# Patient Record
Sex: Female | Born: 1975
Health system: Southern US, Community
[De-identification: ages and names within clinical notes are randomized; demographics above are authoritative.]

## PROBLEM LIST (undated history)

## (undated) DIAGNOSIS — N870 Mild cervical dysplasia: Secondary | ICD-10-CM

## (undated) DIAGNOSIS — M069 Rheumatoid arthritis, unspecified: Secondary | ICD-10-CM

## (undated) DIAGNOSIS — F419 Anxiety disorder, unspecified: Secondary | ICD-10-CM

## (undated) DIAGNOSIS — T7840XA Allergy, unspecified, initial encounter: Secondary | ICD-10-CM

## (undated) DIAGNOSIS — I10 Essential (primary) hypertension: Secondary | ICD-10-CM

## (undated) DIAGNOSIS — B009 Herpesviral infection, unspecified: Secondary | ICD-10-CM

## (undated) DIAGNOSIS — K219 Gastro-esophageal reflux disease without esophagitis: Secondary | ICD-10-CM

## (undated) HISTORY — DX: Essential (primary) hypertension: I10

## (undated) HISTORY — DX: Anxiety disorder, unspecified: F41.9

## (undated) HISTORY — DX: Gastro-esophageal reflux disease without esophagitis: K21.9

## (undated) HISTORY — DX: Allergy, unspecified, initial encounter: T78.40XA

## (undated) HISTORY — DX: Herpesviral infection, unspecified: B00.9

---

## 2014-10-25 HISTORY — PX: BREAST BIOPSY: SHX20

## 2016-12-13 ENCOUNTER — Emergency Department (HOSPITAL_COMMUNITY)
Admission: EM | Admit: 2016-12-13 | Discharge: 2016-12-13 | Disposition: A | Discharge status: Home / Self Care | Payer: Self-pay | Attending: Emergency Medicine | Admitting: Emergency Medicine

## 2016-12-13 ENCOUNTER — Encounter (HOSPITAL_COMMUNITY): Payer: Self-pay | Admitting: *Deleted

## 2016-12-13 DIAGNOSIS — J069 Acute upper respiratory infection, unspecified: Secondary | ICD-10-CM | POA: Insufficient documentation

## 2016-12-13 MED ORDER — KETOROLAC TROMETHAMINE 30 MG/ML IJ SOLN
15.0000 mg | Freq: Once | INTRAMUSCULAR | Status: AC
  Administered 2016-12-13: 15 mg via INTRAVENOUS
  Filled 2016-12-13: qty 1

## 2016-12-13 MED ORDER — SODIUM CHLORIDE 0.9 % IV BOLUS (SEPSIS)
1000.0000 mL | Freq: Once | INTRAVENOUS | Status: AC
  Administered 2016-12-13: 1000 mL via INTRAVENOUS

## 2016-12-13 MED ORDER — ONDANSETRON HCL 4 MG/2ML IJ SOLN
4.0000 mg | Freq: Once | INTRAMUSCULAR | Status: AC
  Administered 2016-12-13: 4 mg via INTRAVENOUS
  Filled 2016-12-13: qty 2

## 2016-12-13 NOTE — ED Provider Notes (Signed)
[facility]-EMERGENCY DEPT Provider Note   CSN: [id number] Arrival date & time: 12/13/16  1046   By signing my name below, I, Avnee Patel, attest that this documentation has been prepared under the direction and in the presence of Benjiman Core, MD  Electronically Signed: Clovis Pu, ED Scribe. 12/13/16. 12:12 PM.   History   Chief Complaint Chief Complaint  Patient presents with  . URI  . Headache   The history is provided by the patient. No language interpreter was used.   HPI Comments:  Brandy Vasquez is a 41 y.o. female who presents to the Emergency Department complaining of persistent productive cough onset 3 days. Pt also reports sore throat, generalized body aches, headache, nausea, diarrhea and recent sick contacts. No alleviating factors noted. Pt denies vomiting, pain with urination, frequency, chance of pregnancy, hx of DM, any major medical problems or any other associated symptoms. Pt is a non-smoker. Marland Kitchen   History reviewed. No pertinent past medical history.  There are no active problems to display for this patient.   History reviewed. No pertinent surgical history.  OB History    No data available       Home Medications    Prior to Admission medications   Not on File    Family History No family history on file.  Social History Social History  Substance Use Topics  . Smoking status: Never Smoker  . Smokeless tobacco: Never Used  . Alcohol use No     Allergies   Bactrim [sulfamethoxazole-trimethoprim]   Review of Systems Review of Systems  HENT: Positive for sore throat.   Respiratory: Positive for cough.   Gastrointestinal: Positive for diarrhea and nausea. Negative for vomiting.  Genitourinary: Negative for frequency.  Musculoskeletal: Positive for myalgias.  Neurological: Positive for headaches.     Physical Exam Updated Vital Signs BP 111/91 (BP Location: Right Arm)   Pulse 105   Temp 99 F (37.2 C) (Oral)   Resp 16   Ht 5\' 6"   (1.676 m)   Wt 195 lb (88.5 kg)   LMP 11/25/2016   SpO2 98%   BMI 31.47 kg/m   Physical Exam  Constitutional: She is oriented to person, place, and time. She appears well-developed and well-nourished. No distress.  HENT:  Head: Normocephalic and atraumatic.  Mouth/Throat: No posterior oropharyngeal edema.  Cardiovascular: Normal rate.   Pulmonary/Chest: Effort normal and breath sounds normal.  Abdominal: She exhibits no distension. There is no tenderness.  Lymphadenopathy:    She has no cervical adenopathy.  Neurological: She is alert and oriented to person, place, and time.  Skin: Skin is warm and dry.  Psychiatric: She has a normal mood and affect.  Nursing note and vitals reviewed.  ED Treatments / Results  COORDINATION OF CARE:  12:10 PM Discussed treatment plan with pt at bedside and pt agreed to plan.  Labs (all labs ordered are listed, but only abnormal results are displayed) Labs Reviewed - No data to display  EKG  EKG Interpretation None       Radiology No results found.  Procedures Procedures (including critical care time)  Medications Ordered in ED Medications - No data to display   Initial Impression / Assessment and Plan / ED Course  I have reviewed the triage vital signs and the nursing notes.  Pertinent labs & imaging results that were available during my care of the patient were reviewed by me and considered in my medical decision making (see chart for details).  Patient presents with URI symptoms. Body aches. Sore throat. Overall benign exam. Lungs are clear. Feels better after IV fluids and some dramatic treatment. Will discharge home.  Final Clinical Impressions(s) / ED Diagnoses   Final diagnoses:  None    New Prescriptions New Prescriptions   No medications on file  I personally performed the services described in this documentation, which was scribed in my presence. The recorded information has been reviewed and is accurate.         Benjiman CoreNathan Datron Brakebill, MD 12/13/16 [phone]

## 2016-12-13 NOTE — ED Triage Notes (Addendum)
PT states headache, cough, nausea, body aches, R sided sore throat since Fri.  Woke up this am and symptoms were worse.

## 2018-04-16 ENCOUNTER — Emergency Department (HOSPITAL_BASED_OUTPATIENT_CLINIC_OR_DEPARTMENT_OTHER)
Admission: EM | Admit: 2018-04-16 | Discharge: 2018-04-16 | Disposition: A | Discharge status: Home / Self Care | Payer: 59 | Attending: Emergency Medicine | Admitting: Emergency Medicine

## 2018-04-16 ENCOUNTER — Encounter (HOSPITAL_BASED_OUTPATIENT_CLINIC_OR_DEPARTMENT_OTHER): Payer: Self-pay

## 2018-04-16 ENCOUNTER — Other Ambulatory Visit: Payer: Self-pay

## 2018-04-16 DIAGNOSIS — M5412 Radiculopathy, cervical region: Secondary | ICD-10-CM | POA: Diagnosis not present

## 2018-04-16 DIAGNOSIS — M542 Cervicalgia: Secondary | ICD-10-CM | POA: Diagnosis present

## 2018-04-16 MED ORDER — METHOCARBAMOL 750 MG PO TABS: 750.0000 mg | ORAL_TABLET | Freq: Two times a day (BID) | ORAL | 0 refills | Status: DC

## 2018-04-16 MED ORDER — NAPROXEN 500 MG PO TABS: 500.0000 mg | ORAL_TABLET | Freq: Two times a day (BID) | ORAL | 0 refills | Status: DC

## 2018-04-16 MED ORDER — NAPROXEN 250 MG PO TABS
500.0000 mg | ORAL_TABLET | Freq: Once | ORAL | Status: AC
  Administered 2018-04-16: 500 mg via ORAL
  Filled 2018-04-16: qty 2

## 2018-04-16 MED ORDER — METHOCARBAMOL 500 MG PO TABS
750.0000 mg | ORAL_TABLET | Freq: Once | ORAL | Status: AC
  Administered 2018-04-16: 750 mg via ORAL
  Filled 2018-04-16: qty 2

## 2018-04-16 NOTE — ED Triage Notes (Signed)
Pt c/o neck pain x 2 months. Today pain started radiating into L arm.

## 2018-04-16 NOTE — ED Notes (Signed)
ED Provider at bedside. 

## 2018-04-16 NOTE — ED Notes (Signed)
Pt given d/c instructions as per chart. Rx x 2 with precautions. Verbalizes understanding. No questions. 

## 2018-04-16 NOTE — ED Provider Notes (Signed)
[facility] EMERGENCY DEPARTMENT Provider Note   CSN: [id number] Arrival date & time: 04/16/18  2156     History   Chief Complaint Chief Complaint  Patient presents with  . Neck Pain    HPI Brandy Vasquez is a 42 y.o. female who is previously healthy who presents with a 55-month history of left-sided neck pain.  Today it started moving into her left arm.  She has had intermittent tingling.  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  She is right-handed.  She has tried [place] powders at home without relief.  She denies any chest pain, shortness of breath, abdominal pain, nausea, vomiting.  She denies any low back pain.  HPI  History reviewed. No pertinent past medical history.  There are no active problems to display for this patient.   History reviewed. No pertinent surgical history.   OB History   None      Home Medications    Prior to Admission medications   Medication Sig Start Date End Date Taking? Authorizing Provider  methocarbamol (ROBAXIN) 750 MG tablet Take 1 tablet (750 mg total) by mouth 2 (two) times daily. 04/16/18   Niamh Rada, Waylan Boga, PA-C  naproxen (NAPROSYN) 500 MG tablet Take 1 tablet (500 mg total) by mouth 2 (two) times daily. 04/16/18   Emi Holes, PA-C    Family History No family history on file.  Social History Social History   Tobacco Use  . Smoking status: Never Smoker  . Smokeless tobacco: Never Used  Substance Use Topics  . Alcohol use: No  . Drug use: No     Allergies   Bactrim [sulfamethoxazole-trimethoprim]   Review of Systems Review of Systems  Constitutional: Negative for fever.  Respiratory: Negative for shortness of breath.   Cardiovascular: Negative for chest pain.  Musculoskeletal: Positive for back pain (L upper), myalgias and neck pain.  Neurological: Positive for numbness (paresthesia).     Physical Exam Updated Vital Signs BP (!) 146/102 (BP Location: Right Arm)   Pulse 66    Temp 98.2 F (36.8 C) (Oral)   Resp 18   Ht 5\' 6"  (1.676 m)   Wt 83.9 kg (185 lb)   LMP 04/02/2018 (Approximate)   SpO2 100%   BMI 29.86 kg/m   Physical Exam  Constitutional: She appears well-developed and well-nourished. No distress.  HENT:  Head: Normocephalic and atraumatic.  Mouth/Throat: Oropharynx is clear and moist. No oropharyngeal exudate.  Eyes: Pupils are equal, round, and reactive to light. Conjunctivae are normal. Right eye exhibits no discharge. Left eye exhibits no discharge. No scleral icterus.  Neck: Normal range of motion. Neck supple. No thyromegaly present.  Cardiovascular: Normal rate, regular rhythm, normal heart sounds and intact distal pulses. Exam reveals no gallop and no friction rub.  No murmur heard. Pulmonary/Chest: Effort normal and breath sounds normal. No stridor. No respiratory distress. She has no wheezes. She has no rales.  Abdominal: Soft. Bowel sounds are normal. She exhibits no distension. There is no tenderness. There is no rebound and no guarding.  Musculoskeletal: She exhibits no edema.       Cervical back: She exhibits tenderness and spasm.  No midline cervical or thoracic tenderness, but tenderness to the left upper trapezius and left thoracic paraspinal muscles, spasm noted  Lymphadenopathy:    She has no cervical adenopathy.  Neurological: She is alert. Coordination normal.  5/5 strength to bilateral upper extremities, equal bilateral grip strength, sensation intact  Skin: Skin is warm and dry. No rash noted. She is not diaphoretic. No pallor.  Psychiatric: She has a normal mood and affect.  Nursing note and vitals reviewed.    ED Treatments / Results  Labs (all labs ordered are listed, but only abnormal results are displayed) Labs Reviewed - No data to display  EKG None  Radiology No results found.  Procedures Procedures (including critical care time)  Medications Ordered in ED Medications  naproxen (NAPROSYN) tablet 500  mg (has no administration in time range)  methocarbamol (ROBAXIN) tablet 750 mg (has no administration in time range)     Initial Impression / Assessment and Plan / ED Course  I have reviewed the triage vital signs and the nursing notes.  Pertinent labs & imaging results that were available during my care of the patient were reviewed by me and considered in my medical decision making (see chart for details).     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  Patient is neurovascularly intact.  Normal neuro exam.  She is noted to have significant spasm to her left upper trapezius.  No bony tenderness.  No indication for imaging today.  We will treat supportively with heat, stretching, massage, NSAIDs, and muscle relaxer.  Follow-up to sports medicine if symptoms are not improving.  Patient understands and agrees with plan.  Strict return precautions discussed.  Patient vitals stable throughout ED course and discharged in satisfactory condition.  Final Clinical Impressions(s) / ED Diagnoses   Final diagnoses:  Cervical radiculopathy    ED Discharge Orders        Ordered    methocarbamol (ROBAXIN) 750 MG tablet  2 times daily     04/16/18 2253    naproxen (NAPROSYN) 500 MG tablet  2 times daily     04/16/18 2253       Emi HolesLaw, Belicia Difatta M, PA-C 04/16/18 2253    Mesner, Barbara CowerJason, MD 04/18/18 1219

## 2018-04-16 NOTE — Discharge Instructions (Signed)
Medications: Naprosyn, Robaxin  Treatment: Take Naprosyn twice daily as prescribed.  You can alternate with Tylenol in between.  Take Robaxin twice daily as needed for muscle pain or spasms.  Do not drive or operate machinery while taking this medication as it can make you feel sedated.  Use heat and ice alternating 20 minutes on, 20 minutes off.  Attempt the exercises and stretches we discussed a few times daily.  You may want to treat yourself to massage.  Follow-up: Please follow-up with Dr. Pearletha ForgeHudnall if your symptoms are not improving over the next couple weeks after trying the above recommendations.  Please return to the emergency department if you develop any new or worsening symptoms including complete numbness of your arm, or any other new or concerning symptom.

## 2018-08-31 DIAGNOSIS — N870 Mild cervical dysplasia: Secondary | ICD-10-CM | POA: Insufficient documentation

## 2019-02-01 ENCOUNTER — Other Ambulatory Visit: Payer: Self-pay

## 2019-02-01 ENCOUNTER — Encounter (HOSPITAL_BASED_OUTPATIENT_CLINIC_OR_DEPARTMENT_OTHER): Payer: Self-pay | Admitting: *Deleted

## 2019-02-01 ENCOUNTER — Emergency Department (HOSPITAL_BASED_OUTPATIENT_CLINIC_OR_DEPARTMENT_OTHER): Payer: 59

## 2019-02-01 ENCOUNTER — Emergency Department (HOSPITAL_BASED_OUTPATIENT_CLINIC_OR_DEPARTMENT_OTHER)
Admission: EM | Admit: 2019-02-01 | Discharge: 2019-02-01 | Disposition: A | Discharge status: Home / Self Care | Payer: 59 | Attending: Emergency Medicine | Admitting: Emergency Medicine

## 2019-02-01 DIAGNOSIS — R1013 Epigastric pain: Secondary | ICD-10-CM

## 2019-02-01 LAB — CBC WITH DIFFERENTIAL/PLATELET
Abs Immature Granulocytes: 0.03 10*3/uL (ref 0.00–0.07)
Basophils Absolute: 0 10*3/uL (ref 0.0–0.1)
Basophils Relative: 0 %
Eosinophils Absolute: 0.3 10*3/uL (ref 0.0–0.5)
Eosinophils Relative: 2 %
HCT: 41.4 % (ref 36.0–46.0)
Hemoglobin: 13.5 g/dL (ref 12.0–15.0)
Immature Granulocytes: 0 %
Lymphocytes Relative: 8 %
Lymphs Abs: 1 10*3/uL (ref 0.7–4.0)
MCH: 31.2 pg (ref 26.0–34.0)
MCHC: 32.6 g/dL (ref 30.0–36.0)
MCV: 95.6 fL (ref 80.0–100.0)
Monocytes Absolute: 0.7 10*3/uL (ref 0.1–1.0)
Monocytes Relative: 6 %
Neutro Abs: 10.3 10*3/uL — ABNORMAL HIGH (ref 1.7–7.7)
Neutrophils Relative %: 84 %
Platelets: 275 10*3/uL (ref 150–400)
RBC: 4.33 MIL/uL (ref 3.87–5.11)
RDW: 12.1 % (ref 11.5–15.5)
WBC: 12.4 10*3/uL — ABNORMAL HIGH (ref 4.0–10.5)
nRBC: 0 % (ref 0.0–0.2)

## 2019-02-01 LAB — URINALYSIS, ROUTINE W REFLEX MICROSCOPIC
Bilirubin Urine: NEGATIVE
Glucose, UA: NEGATIVE mg/dL
Ketones, ur: NEGATIVE mg/dL
Leukocytes,Ua: NEGATIVE
Nitrite: NEGATIVE
Protein, ur: NEGATIVE mg/dL
Specific Gravity, Urine: 1.015 (ref 1.005–1.030)
pH: 6.5 (ref 5.0–8.0)

## 2019-02-01 LAB — COMPREHENSIVE METABOLIC PANEL
ALT: 17 U/L (ref 0–44)
AST: 23 U/L (ref 15–41)
Albumin: 4.2 g/dL (ref 3.5–5.0)
Alkaline Phosphatase: 88 U/L (ref 38–126)
Anion gap: 6 (ref 5–15)
BUN: 10 mg/dL (ref 6–20)
CO2: 25 mmol/L (ref 22–32)
Calcium: 8.7 mg/dL — ABNORMAL LOW (ref 8.9–10.3)
Chloride: 107 mmol/L (ref 98–111)
Creatinine, Ser: 0.7 mg/dL (ref 0.44–1.00)
GFR calc Af Amer: >60 mL/min (ref >60)
GFR calc non Af Amer: >60 mL/min (ref >60)
Glucose, Bld: 87 mg/dL (ref 70–99)
Potassium: 3.5 mmol/L (ref 3.5–5.1)
Sodium: 138 mmol/L (ref 135–145)
Total Bilirubin: 1.1 mg/dL (ref 0.3–1.2)
Total Protein: 7.1 g/dL (ref 6.5–8.1)

## 2019-02-01 LAB — LIPASE, BLOOD: Lipase: 40 U/L (ref 11–51)

## 2019-02-01 LAB — URINALYSIS, MICROSCOPIC (REFLEX)

## 2019-02-01 LAB — PREGNANCY, URINE: Preg Test, Ur: NEGATIVE

## 2019-02-01 MED ORDER — IOHEXOL 300 MG/ML  SOLN
100.0000 mL | Freq: Once | INTRAMUSCULAR | Status: AC | PRN
  Administered 2019-02-01: 16:00:00 100 mL via INTRAVENOUS

## 2019-02-01 MED ORDER — HYDROMORPHONE HCL 1 MG/ML IJ SOLN
0.5000 mg | Freq: Once | INTRAMUSCULAR | Status: AC
  Administered 2019-02-01: 0.5 mg via INTRAVENOUS
  Filled 2019-02-01: qty 1

## 2019-02-01 MED ORDER — FAMOTIDINE 20 MG PO TABS: 20.0000 mg | ORAL_TABLET | Freq: Two times a day (BID) | ORAL | 0 refills | Status: DC

## 2019-02-01 MED ORDER — ONDANSETRON HCL 4 MG/2ML IJ SOLN
4.0000 mg | Freq: Once | INTRAMUSCULAR | Status: AC
  Administered 2019-02-01: 16:00:00 4 mg via INTRAVENOUS
  Filled 2019-02-01: qty 2

## 2019-02-01 MED ORDER — PANTOPRAZOLE SODIUM 40 MG IV SOLR
40.0000 mg | Freq: Once | INTRAVENOUS | Status: AC
  Administered 2019-02-01: 16:00:00 40 mg via INTRAVENOUS
  Filled 2019-02-01: qty 40

## 2019-02-01 MED ORDER — SODIUM CHLORIDE 0.9 % IV SOLN
INTRAVENOUS | Status: DC
  Administered 2019-02-01: 16:00:00 via INTRAVENOUS

## 2019-02-01 NOTE — ED Notes (Signed)
Initial contact with patient. Patient ambulatory to BR with steady gait. A&ox4.

## 2019-02-01 NOTE — ED Provider Notes (Signed)
[facility] EMERGENCY DEPARTMENT Provider Note   CSN: [id number] Arrival date & time: 02/01/19  1458    History   Chief Complaint Chief Complaint  Patient presents with  . Abdominal Pain    HPI Brandy Vasquez is a 43 y.o. female.     Patient with acute onset of epigastric abdominal pain.  Does not radiate to back.  Associated with nausea.  Onset of pain was at 8 PM last evening.  Pain is been constant since then.  Never had pain like this before.  Patient denies any fever body aches cough or congestion.  No vomiting.     History reviewed. No pertinent past medical history.  There are no active problems to display for this patient.   History reviewed. No pertinent surgical history.   OB History   No obstetric history on file.      Home Medications    Prior to Admission medications   Medication Sig Start Date End Date Taking? Authorizing Provider  famotidine (PEPCID) 20 MG tablet Take 1 tablet (20 mg total) by mouth 2 (two) times daily. 02/01/19   Vanetta Mulders, MD    Family History History reviewed. No pertinent family history.  Social History Social History   Tobacco Use  . Smoking status: Never Smoker  . Smokeless tobacco: Never Used  Substance Use Topics  . Alcohol use: No  . Drug use: No     Allergies   Bactrim [sulfamethoxazole-trimethoprim]   Review of Systems Review of Systems  Constitutional: Negative for chills and fever.  HENT: Negative for rhinorrhea and sore throat.   Eyes: Negative for visual disturbance.  Respiratory: Negative for cough and shortness of breath.   Cardiovascular: Negative for chest pain and leg swelling.  Gastrointestinal: Positive for abdominal pain and nausea. Negative for diarrhea and vomiting.  Genitourinary: Negative for dysuria.  Musculoskeletal: Negative for back pain and neck pain.  Skin: Negative for rash.  Neurological: Negative for dizziness, light-headedness and headaches.  Hematological: Does  not bruise/bleed easily.  Psychiatric/Behavioral: Negative for confusion.     Physical Exam Updated Vital Signs BP 124/90   Pulse 75   Temp 99.1 F (37.3 C) (Oral)   Resp 17   Ht 1.676 m (5\' 6" )   Wt 88.5 kg   SpO2 94%   BMI 31.47 kg/m   Physical Exam Vitals signs and nursing note reviewed.  Constitutional:      General: She is not in acute distress.    Appearance: She is well-developed.  HENT:     Head: Normocephalic and atraumatic.     Nose: No congestion.  Eyes:     Extraocular Movements: Extraocular movements intact.     Conjunctiva/sclera: Conjunctivae normal.     Pupils: Pupils are equal, round, and reactive to light.  Neck:     Musculoskeletal: Neck supple.  Cardiovascular:     Rate and Rhythm: Normal rate and regular rhythm.     Heart sounds: Normal heart sounds. No murmur.  Pulmonary:     Effort: Pulmonary effort is normal. No respiratory distress.     Breath sounds: Normal breath sounds. No wheezing.  Abdominal:     General: Bowel sounds are normal.     Palpations: Abdomen is soft.     Tenderness: There is abdominal tenderness. There is no guarding.     Comments: Tender to palpation epigastric area.  No significant tenderness to right upper quadrant.  No lower quadrant tenderness.  Musculoskeletal:  General: No swelling.  Skin:    General: Skin is warm and dry.  Neurological:     General: No focal deficit present.     Mental Status: She is alert and oriented to person, place, and time.      ED Treatments / Results  Labs (all labs ordered are listed, but only abnormal results are displayed) Labs Reviewed  URINALYSIS, ROUTINE W REFLEX MICROSCOPIC - Abnormal; Notable for the following components:      Result Value   APPearance HAZY (*)    Hgb urine dipstick TRACE (*)    All other components within normal limits  COMPREHENSIVE METABOLIC PANEL - Abnormal; Notable for the following components:   Calcium 8.7 (*)    All other components within  normal limits  CBC WITH DIFFERENTIAL/PLATELET - Abnormal; Notable for the following components:   WBC 12.4 (*)    Neutro Abs 10.3 (*)    All other components within normal limits  URINALYSIS, MICROSCOPIC (REFLEX) - Abnormal; Notable for the following components:   Bacteria, UA MANY (*)    All other components within normal limits  PREGNANCY, URINE  LIPASE, BLOOD    EKG EKG Interpretation  Date/Time:  Thursday February 01 2019 15:31:25 EDT Ventricular Rate:  91 PR Interval:    QRS Duration: 85 QT Interval:  350 QTC Calculation: 431 R Axis:   42 Text Interpretation:  Sinus rhythm Low voltage, precordial leads Borderline T abnormalities, anterior leads No previous ECGs available Confirmed by Vanetta Mulders [phone]) on 02/01/2019 3:33:47 PM Also confirmed by Vanetta Mulders [phone]), editor Barbette Hair [phone])  on 02/01/2019 3:35:26 PM   Radiology Ct Abdomen Pelvis W Contrast  Result Date: 02/01/2019 CLINICAL DATA:  Epigastric abdominal pain, nausea EXAM: CT ABDOMEN AND PELVIS WITH CONTRAST TECHNIQUE: Multidetector CT imaging of the abdomen and pelvis was performed using the standard protocol following bolus administration of intravenous contrast. CONTRAST:  OMNIPAQUE IOHEXOL 300 MG/ML  SOLN COMPARISON:  None. FINDINGS: Lower chest: No acute abnormality. Hepatobiliary: No focal liver abnormality is seen. No gallstones, gallbladder wall thickening, or biliary dilatation. Pancreas: Unremarkable. No pancreatic ductal dilatation or surrounding inflammatory changes. Spleen: Normal in size without focal abnormality. Adrenals/Urinary Tract: Adrenal glands are unremarkable. Kidneys are normal, without renal calculi, focal lesion, or hydronephrosis. Bladder is unremarkable. Stomach/Bowel: Stomach is within normal limits. Appendix appears normal. No evidence of bowel wall thickening, distention, or inflammatory changes. Occasional sigmoid diverticula. Vascular/Lymphatic: No significant vascular  findings are present. No enlarged abdominal or pelvic lymph nodes. Reproductive: No mass or other abnormality. Other: No abdominal wall hernia or abnormality. Small volume free fluid in the low pelvis, likely functional. Musculoskeletal: No acute or significant osseous findings. IMPRESSION: 1. No acute CT findings of the abdomen or pelvis to explain epigastric abdominal pain. 2. Occasional sigmoid diverticula without evidence of acute diverticulitis. 3. Small volume free fluid in the low pelvis, likely functional. Electronically Signed   By: Lauralyn Primes M.D.   On: 02/01/2019 16:48    Procedures Procedures (including critical care time)  Medications Ordered in ED Medications  0.9 %  sodium chloride infusion ( Intravenous New Bag/Given 02/01/19 1534)  ondansetron (ZOFRAN) injection 4 mg (4 mg Intravenous Given 02/01/19 1535)  HYDROmorphone (DILAUDID) injection 0.5 mg (0.5 mg Intravenous Given 02/01/19 1541)  pantoprazole (PROTONIX) injection 40 mg (40 mg Intravenous Given 02/01/19 1535)  iohexol (OMNIPAQUE) 300 MG/ML solution 100 mL (100 mLs Intravenous Contrast Given 02/01/19 1621)     Initial Impression / Assessment and Plan /  ED Course  I have reviewed the triage vital signs and the nursing notes.  Pertinent labs & imaging results that were available during my care of the patient were reviewed by me and considered in my medical decision making (see chart for details).       CT scan without any acute findings.  Labs without significant abnormality we will give a 2-week course of Pepcid in case is peptic ulcer disease.  Patient will return for any new or worse symptoms.  If symptoms persist beyond that she will get follow-up either here or somewhere else for possible referral to gastroenterology.   Final Clinical Impressions(s) / ED Diagnoses   Final diagnoses:  Epigastric pain    ED Discharge Orders         Ordered    famotidine (PEPCID) 20 MG tablet  2 times daily     02/01/19 1659            Vanetta MuldersZackowski, Retal Tonkinson, MD 02/01/19 1702

## 2019-02-01 NOTE — ED Notes (Signed)
Patient verbalizes DC instructions. Ambulatory to ED exit with steady gait.

## 2019-02-01 NOTE — ED Triage Notes (Signed)
Pt c/ epigastric abd pain x 2 days nausea only no relief with gas med

## 2019-02-01 NOTE — Discharge Instructions (Addendum)
Take the Pepcid as directed over the next 2 weeks.  Return for any new or worse symptoms.  Also will need follow-up if you do not improve taking the medication.  CT scan of the abdomen and pelvis without any significant findings.  Can also take Tylenol with this medicine.  Work note provided.

## 2019-02-01 NOTE — ED Notes (Signed)
Patient to CT in stable condition.

## 2019-06-02 ENCOUNTER — Other Ambulatory Visit: Payer: Self-pay

## 2019-06-02 ENCOUNTER — Encounter (HOSPITAL_BASED_OUTPATIENT_CLINIC_OR_DEPARTMENT_OTHER): Payer: Self-pay | Admitting: Adult Health

## 2019-06-02 ENCOUNTER — Emergency Department (HOSPITAL_BASED_OUTPATIENT_CLINIC_OR_DEPARTMENT_OTHER): Payer: 59

## 2019-06-02 ENCOUNTER — Emergency Department (HOSPITAL_BASED_OUTPATIENT_CLINIC_OR_DEPARTMENT_OTHER)
Admission: EM | Admit: 2019-06-02 | Discharge: 2019-06-02 | Disposition: A | Discharge status: Home / Self Care | Payer: 59 | Attending: Emergency Medicine | Admitting: Emergency Medicine

## 2019-06-02 DIAGNOSIS — Z882 Allergy status to sulfonamides status: Secondary | ICD-10-CM | POA: Insufficient documentation

## 2019-06-02 DIAGNOSIS — M25561 Pain in right knee: Secondary | ICD-10-CM | POA: Insufficient documentation

## 2019-06-02 MED ORDER — NAPROXEN 500 MG PO TABS: 500.0000 mg | ORAL_TABLET | Freq: Two times a day (BID) | ORAL | 0 refills | Status: AC

## 2019-06-02 NOTE — ED Triage Notes (Signed)
Brandy Vasquez presents with 10 days of right leg pain that started in the anterior, medial aspect of the knee, since it has spread to to medial aspect of the thigh and down the anterior side of the leg to the ankle. No swelling noted.

## 2019-06-02 NOTE — Discharge Instructions (Addendum)
Your x-ray today was within normal limits, I have provided a knee sleeve, please wear this for your comfort.  You may continue taking anti-inflammatories to help with your symptoms.  You may also apply ice or heat to the area along with elevate your leg.  Phone number to Dr. Karlton Lemon is attached to your chart, please schedule an appointment for further evaluation of your right knee pain.

## 2019-06-02 NOTE — ED Provider Notes (Signed)
[facility] EMERGENCY DEPARTMENT Provider Note   CSN: [id number] Arrival date & time: 06/02/19  1752    History   Chief Complaint Chief Complaint  Patient presents with  . Leg Pain    HPI Brandy Vasquez is a 43 y.o. female.     43 y.o female with no PMH presents to the ED with a chief complaint of right knee pain x2 weeks.  Patient reports she originally noted pain along the medial aspect of her knee which has now turned into the lateral aspect with radiation onto the right foot.  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  She reports some pain along her foot worse with dorsiflexion.  She has taken some ibuprofen, Aleve along with elevating her knee without improvement in symptoms.  She also endorses some swelling to her right knee along with bruising.  Denies any prior history of hemophilia, rash, fever, IV drug use or trauma.  The history is provided by the patient.  Leg Pain Associated symptoms: no fever     History reviewed. No pertinent past medical history.  There are no active problems to display for this patient.   History reviewed. No pertinent surgical history.   OB History   No obstetric history on file.      Home Medications    Prior to Admission medications   Medication Sig Start Date End Date Taking? Authorizing Provider  famotidine (PEPCID) 20 MG tablet Take 1 tablet (20 mg total) by mouth 2 (two) times daily. 02/01/19   Vanetta MuldersZackowski, Scott, MD  naproxen (NAPROSYN) 500 MG tablet Take 1 tablet (500 mg total) by mouth 2 (two) times daily for 7 days. 06/02/19 06/09/19  Claude MangesSoto, Tagen Brethauer, PA-C    Family History History reviewed. No pertinent family history.  Social History Social History   Tobacco Use  . Smoking status: Never Smoker  . Smokeless tobacco: Never Used  Substance Use Topics  . Alcohol use: No  . Drug use: No     Allergies   Bactrim [sulfamethoxazole-trimethoprim]   Review of  Systems Review of Systems  Constitutional: Negative for fever.  Musculoskeletal: Positive for arthralgias.     Physical Exam Updated Vital Signs BP (!) 127/96   Pulse 79   Temp 98.5 F (36.9 C) (Oral)   Resp 18   Ht 5\' 6"  (1.676 m)   Wt 88.5 kg   LMP 05/15/2019   SpO2 99%   BMI 31.47 kg/m   Physical Exam Vitals signs and nursing note reviewed.  Constitutional:      Appearance: Normal appearance.  HENT:     Head: Normocephalic and atraumatic.     Nose: No rhinorrhea.     Mouth/Throat:     Mouth: Mucous membranes are moist.  Eyes:     Pupils: Pupils are equal, round, and reactive to light.  Cardiovascular:     Rate and Rhythm: Normal rate.  Pulmonary:     Effort: Pulmonary effort is normal.  Abdominal:     General: Abdomen is flat.     Palpations: Abdomen is soft.  Musculoskeletal:        General: Tenderness present.     Right knee: She exhibits swelling and deformity. She exhibits normal range of motion, no laceration, no erythema, normal alignment and no LCL laxity.     Comments: Neurovascularly intact, slight bruising noted to the medial knee.  No laxity on exam.  No deformity, effusion noted.  Neurological:     Mental Status: She is alert and oriented to person, place, and time.      ED Treatments / Results  Labs (all labs ordered are listed, but only abnormal results are displayed) Labs Reviewed - No data to display  EKG None  Radiology Dg Knee 2 Views Right  Result Date: 06/02/2019 CLINICAL DATA:  Pain for 10 days without trauma. EXAM: RIGHT KNEE - 1-2 VIEW COMPARISON:  None. FINDINGS: No evidence of fracture, dislocation, or joint effusion. No evidence of arthropathy or other focal bone abnormality. Soft tissues are unremarkable. IMPRESSION: Negative. Electronically Signed   By: Dorise Bullion III M.D   On: 06/02/2019 18:58    Procedures Procedures (including critical care time)  Medications Ordered in ED Medications - No data to display    Initial Impression / Assessment and Plan / ED Course  I have reviewed the triage vital signs and the nursing notes.  Pertinent labs & imaging results that were available during my care of the patient were reviewed by me and considered in my medical decision making (see chart for details).    Patient with no pertinent medical history presents to the ED with complaints of right knee pain, this is been going on for the past 2-week, states this is now causing pain along her right foot, right hip.  Patient has been taking over-the-counter medication without improvement in symptoms.  She reports applying ice along with elevation without much improvement.  During primary evaluation there is some pain along the medial aspect of her knee, there is also some bruising noted below her patella, no prior history of hemophilia.  An x-ray was obtained to further evaluate patient's condition, no fracture, dislocation or effusion noted.  No palpable effusion noted on my exam.  Mild swelling appreciated.  Will place patient on a knee sleeve along with have her follow-up with Dr. Karlton Lemon on an outpatient basis.  She also go home with a short course of anti-inflammatories to help with her pain.  No fevers, trauma, rash, gynecological complaints feel patient is appropriate for outpatient treatment.  Return precautions discussed at length.   Portions of this note were generated with L[employer]. Dictation errors may occur despite best attempts at proofreading.   Final Clinical Impressions(s) / ED Diagnoses   Final diagnoses:  Acute pain of right knee    ED Discharge Orders         Ordered    naproxen (NAPROSYN) 500 MG tablet  2 times daily     06/02/19 1919           Janeece Fitting, Hershal Coria 06/02/19 1919    Hayden Rasmussen, MD 06/02/19 1945

## 2019-06-03 ENCOUNTER — Telehealth: Payer: Self-pay | Admitting: Surgery

## 2019-06-03 NOTE — Telephone Encounter (Signed)
Received call concerning discharge prescription clarification. RNCM clarified prescription no further ED CM needs identified.

## 2020-01-08 DIAGNOSIS — B9689 Other specified bacterial agents as the cause of diseases classified elsewhere: Secondary | ICD-10-CM | POA: Diagnosis not present

## 2020-01-08 DIAGNOSIS — N930 Postcoital and contact bleeding: Secondary | ICD-10-CM | POA: Diagnosis not present

## 2020-01-08 DIAGNOSIS — N760 Acute vaginitis: Secondary | ICD-10-CM | POA: Diagnosis not present

## 2020-01-08 DIAGNOSIS — Z30431 Encounter for routine checking of intrauterine contraceptive device: Secondary | ICD-10-CM | POA: Diagnosis not present

## 2020-01-08 DIAGNOSIS — Z124 Encounter for screening for malignant neoplasm of cervix: Secondary | ICD-10-CM | POA: Diagnosis not present

## 2020-01-08 DIAGNOSIS — Z113 Encounter for screening for infections with a predominantly sexual mode of transmission: Secondary | ICD-10-CM | POA: Diagnosis not present

## 2020-06-25 DIAGNOSIS — Z30433 Encounter for removal and reinsertion of intrauterine contraceptive device: Secondary | ICD-10-CM | POA: Diagnosis not present

## 2020-06-25 DIAGNOSIS — Z3202 Encounter for pregnancy test, result negative: Secondary | ICD-10-CM | POA: Diagnosis not present

## 2020-07-06 ENCOUNTER — Emergency Department (HOSPITAL_BASED_OUTPATIENT_CLINIC_OR_DEPARTMENT_OTHER)
Admission: EM | Admit: 2020-07-06 | Discharge: 2020-07-06 | Disposition: A | Discharge status: Home / Self Care | Payer: BC Managed Care – PPO | Attending: Emergency Medicine | Admitting: Emergency Medicine

## 2020-07-06 ENCOUNTER — Other Ambulatory Visit: Payer: Self-pay

## 2020-07-06 ENCOUNTER — Encounter (HOSPITAL_BASED_OUTPATIENT_CLINIC_OR_DEPARTMENT_OTHER): Payer: Self-pay | Admitting: *Deleted

## 2020-07-06 DIAGNOSIS — U071 COVID-19: Secondary | ICD-10-CM

## 2020-07-06 DIAGNOSIS — J069 Acute upper respiratory infection, unspecified: Secondary | ICD-10-CM | POA: Diagnosis not present

## 2020-07-06 LAB — SARS CORONAVIRUS 2 BY RT PCR (HOSPITAL ORDER, PERFORMED IN ~~LOC~~ HOSPITAL LAB): SARS Coronavirus 2: POSITIVE — AB

## 2020-07-06 MED ORDER — ALBUTEROL SULFATE HFA 108 (90 BASE) MCG/ACT IN AERS
1.0000 | INHALATION_SPRAY | RESPIRATORY_TRACT | Status: DC | PRN
  Administered 2020-07-06: 1 via RESPIRATORY_TRACT
  Filled 2020-07-06: qty 6.7

## 2020-07-06 MED ORDER — IBUPROFEN 800 MG PO TABS
800.0000 mg | ORAL_TABLET | Freq: Once | ORAL | Status: AC
  Administered 2020-07-06: 800 mg via ORAL
  Filled 2020-07-06: qty 1

## 2020-07-06 NOTE — ED Provider Notes (Signed)
[facility] EMERGENCY DEPARTMENT Provider Note   CSN: [id number] Arrival date & time: 07/06/20  1539     History Chief Complaint  Patient presents with  . URI    Brandy Vasquez is a 44 y.o. female here presenting with headache and cough.  Patient received her first dose of Moderna vaccine on September 3.  Patient states that for the last week or so she has been having some headaches and cough and fever.  She denies any particular Covid exposures.  Denies any vomiting.  The history is provided by the patient.       History reviewed. No pertinent past medical history.  There are no problems to display for this patient.   History reviewed. No pertinent surgical history.   OB History   No obstetric history on file.     History reviewed. No pertinent family history.  Social History   Tobacco Use  . Smoking status: Never Smoker  . Smokeless tobacco: Never Used  Vaping Use  . Vaping Use: Never used  Substance Use Topics  . Alcohol use: No  . Drug use: No    Home Medications Prior to Admission medications   Medication Sig Start Date End Date Taking? Authorizing Provider  famotidine (PEPCID) 20 MG tablet Take 1 tablet (20 mg total) by mouth 2 (two) times daily. 02/01/19   Vanetta Mulders, MD    Allergies    Bactrim [sulfamethoxazole-trimethoprim]  Review of Systems   Review of Systems  Constitutional: Positive for fever.  Respiratory: Positive for cough.   Neurological: Positive for headaches.  All other systems reviewed and are negative.   Physical Exam Updated Vital Signs BP (!) 132/99 (BP Location: Left Arm)   Pulse 94   Temp 99.8 F (37.7 C) (Oral)   Resp 18   Ht 5\' 6"  (1.676 m)   Wt 88.5 kg   SpO2 99%   BMI 31.47 kg/m   Physical Exam Vitals and nursing note reviewed.  Constitutional:      Comments: Slightly uncomfortable  HENT:     Head: Normocephalic.  Eyes:     Extraocular Movements: Extraocular movements intact.     Pupils:  Pupils are equal, round, and reactive to light.  Neck:     Comments: No meningeal signs Cardiovascular:     Rate and Rhythm: Normal rate and regular rhythm.     Pulses: Normal pulses.     Heart sounds: Normal heart sounds.  Pulmonary:     Effort: Pulmonary effort is normal.     Breath sounds: Normal breath sounds.  Abdominal:     General: Abdomen is flat.     Palpations: Abdomen is soft.  Musculoskeletal:        General: Normal range of motion.     Cervical back: Normal range of motion and neck supple.  Skin:    General: Skin is warm.     Capillary Refill: Capillary refill takes less than 2 seconds.  Neurological:     General: No focal deficit present.     Mental Status: She is alert and oriented to person, place, and time.  Psychiatric:        Mood and Affect: Mood normal.        Behavior: Behavior normal.     ED Results / Procedures / Treatments   Labs (all labs ordered are listed, but only abnormal results are displayed) Labs Reviewed  SARS CORONAVIRUS 2 BY RT PCR (HOSPITAL ORDER, PERFORMED IN [facility]) -  Abnormal; Notable for the following components:      Result Value   SARS Coronavirus 2 POSITIVE (*)    All other components within normal limits    EKG None  Radiology No results found.  Procedures Procedures (including critical care time)  Medications Ordered in ED Medications - No data to display  ED Course  I have reviewed the triage vital signs and the nursing notes.  Pertinent labs & imaging results that were available during my care of the patient were reviewed by me and considered in my medical decision making (see chart for details).    MDM Rules/Calculators/A&P                          Brandy Vasquez is a 44 y.o. female here presenting with cough and headaches.  Patient received first dose of Moderna vaccine a week ago and has positive COVID test in the ED. She is not hypoxic. She is not considered fully vaccinated so this is not a  breakthrough infection.  Protuberant some supportive measures.  Will give albuterol as needed   Final Clinical Impression(s) / ED Diagnoses Final diagnoses:  None    Rx / DC Orders ED Discharge Orders    None       Charlynne Pander, MD 07/06/20 1906

## 2020-07-06 NOTE — ED Triage Notes (Signed)
Cough, body aches, fever-tmax 101-x 2 days. covid vaccine x 1 last week.

## 2020-07-06 NOTE — Discharge Instructions (Signed)
Use albuterol as needed for cough.  Take Tylenol or Motrin for fever.  You have Covid and will need to stay home for 10 days.  Your close contacts also need to be quarantined  See your doctor for follow up  Return to ER if you have worse cough, fever, trouble breathing, headaches.

## 2020-07-07 ENCOUNTER — Telehealth: Payer: Self-pay | Admitting: Unknown Physician Specialty

## 2020-07-07 ENCOUNTER — Other Ambulatory Visit: Payer: Self-pay | Admitting: Unknown Physician Specialty

## 2020-07-07 DIAGNOSIS — U071 COVID-19: Secondary | ICD-10-CM

## 2020-07-07 DIAGNOSIS — E663 Overweight: Secondary | ICD-10-CM

## 2020-07-07 NOTE — Telephone Encounter (Signed)
I connected by phone with Brandy Vasquez on 07/07/2020 at 2:24 PM to discuss the potential use of a new treatment for mild to moderate COVID-19 viral infection in non-hospitalized patients.  This patient is a 44 y.o. female that meets the FDA criteria for Emergency Use Authorization of COVID monoclonal antibody casirivimab/imdevimab.  Has a (+) direct SARS-CoV-2 viral test result  Has mild or moderate COVID-19   Is NOT hospitalized due to COVID-19  Is within 10 days of symptom onset  Has at least one of the high risk factor(s) for progression to severe COVID-19 and/or hospitalization as defined in EUA.  Specific high risk criteria : BMI > 25   I have spoken and communicated the following to the patient or parent/caregiver regarding COVID monoclonal antibody treatment:  1. FDA has authorized the emergency use for the treatment of mild to moderate COVID-19 in adults and pediatric patients with positive results of direct SARS-CoV-2 viral testing who are 68 years of age and older weighing at least 40 kg, and who are at high risk for progressing to severe COVID-19 and/or hospitalization.  2. The significant known and potential risks and benefits of COVID monoclonal antibody, and the extent to which such potential risks and benefits are unknown.  3. Information on available alternative treatments and the risks and benefits of those alternatives, including clinical trials.  4. Patients treated with COVID monoclonal antibody should continue to self-isolate and use infection control measures (e.g., wear mask, isolate, social distance, avoid sharing personal items, clean and disinfect "high touch" surfaces, and frequent handwashing) according to CDC guidelines.   5. The patient or parent/caregiver has the option to accept or refuse COVID monoclonal antibody treatment.  After reviewing this information with the patient, The patient agreed to proceed with receiving casirivimab\imdevimab infusion and  will be provided a copy of the Fact sheet prior to receiving the infusion. [name] 07/07/2020 2:24 PM  Sx onset 9/11

## 2020-07-07 NOTE — Telephone Encounter (Signed)
I connected by phone with Brandy Vasquez on 07/07/2020 at 11:01 AM to discuss the potential use of a new treatment for mild to moderate COVID-19 viral infection in non-hospitalized patients.  This patient is a 44 y.o. female that meets the FDA criteria for Emergency Use Authorization of COVID monoclonal antibody casirivimab/imdevimab.  Has a (+) direct SARS-CoV-2 viral test result  Has mild or moderate COVID-19   Is NOT hospitalized due to COVID-19  Is within 10 days of symptom onset  Has at least one of the high risk factor(s) for progression to severe COVID-19 and/or hospitalization as defined in EUA.  Specific high risk criteria : BMI > 25   I have spoken and communicated the following to the patient or parent/caregiver regarding COVID monoclonal antibody treatment:  1. FDA has authorized the emergency use for the treatment of mild to moderate COVID-19 in adults and pediatric patients with positive results of direct SARS-CoV-2 viral testing who are 71 years of age and older weighing at least 40 kg, and who are at high risk for progressing to severe COVID-19 and/or hospitalization.  2. The significant known and potential risks and benefits of COVID monoclonal antibody, and the extent to which such potential risks and benefits are unknown.  3. Information on available alternative treatments and the risks and benefits of those alternatives, including clinical trials.  4. Patients treated with COVID monoclonal antibody should continue to self-isolate and use infection control measures (e.g., wear mask, isolate, social distance, avoid sharing personal items, clean and disinfect "high touch" surfaces, and frequent handwashing) according to CDC guidelines.   5. The patient or parent/caregiver has the option to accept or refuse COVID monoclonal antibody treatment.  After reviewing this information with the patient, she would like to think about infusion.   [name] 07/07/2020 11:01  AM

## 2020-07-08 ENCOUNTER — Ambulatory Visit (HOSPITAL_COMMUNITY)
Admission: RE | Admit: 2020-07-08 | Discharge: 2020-07-08 | Disposition: A | Discharge status: Home / Self Care | Payer: BC Managed Care – PPO | Source: Ambulatory Visit | Attending: Pulmonary Disease | Admitting: Pulmonary Disease

## 2020-07-08 DIAGNOSIS — U071 COVID-19: Secondary | ICD-10-CM | POA: Insufficient documentation

## 2020-07-08 DIAGNOSIS — E663 Overweight: Secondary | ICD-10-CM | POA: Insufficient documentation

## 2020-07-08 MED ORDER — METHYLPREDNISOLONE SODIUM SUCC 125 MG IJ SOLR: 125.0000 mg | Freq: Once | INTRAMUSCULAR | Status: DC | PRN

## 2020-07-08 MED ORDER — EPINEPHRINE 0.3 MG/0.3ML IJ SOAJ: 0.3000 mg | Freq: Once | INTRAMUSCULAR | Status: DC | PRN

## 2020-07-08 MED ORDER — FAMOTIDINE IN NACL 20-0.9 MG/50ML-% IV SOLN: 20.0000 mg | Freq: Once | INTRAVENOUS | Status: DC | PRN

## 2020-07-08 MED ORDER — SODIUM CHLORIDE 0.9 % IV SOLN: INTRAVENOUS | Status: DC | PRN

## 2020-07-08 MED ORDER — ALBUTEROL SULFATE HFA 108 (90 BASE) MCG/ACT IN AERS: 2.0000 | INHALATION_SPRAY | Freq: Once | RESPIRATORY_TRACT | Status: DC | PRN

## 2020-07-08 MED ORDER — SODIUM CHLORIDE 0.9 % IV SOLN
1200.0000 mg | Freq: Once | INTRAVENOUS | Status: AC
  Administered 2020-07-08: 1200 mg via INTRAVENOUS
  Filled 2020-07-08: qty 10

## 2020-07-08 MED ORDER — DIPHENHYDRAMINE HCL 50 MG/ML IJ SOLN: 50.0000 mg | Freq: Once | INTRAMUSCULAR | Status: DC | PRN

## 2020-07-08 MED ORDER — ACETAMINOPHEN 325 MG PO TABS
650.0000 mg | ORAL_TABLET | Freq: Once | ORAL | Status: AC
  Administered 2020-07-08: 650 mg via ORAL
  Filled 2020-07-08: qty 2

## 2020-07-08 NOTE — Discharge Instructions (Signed)

## 2020-07-08 NOTE — Progress Notes (Signed)
  Diagnosis: COVID-19  Physician: Dr. Wright  Procedure: [facility] Infusion [facility] Med: casirivimab\imdevimab infusion - Provided patient with casirivimab\imdevimab fact sheet for patients, parents and caregivers prior to infusion.  Complications: No immediate complications noted.  Discharge: Discharged home   Cylan Borum M Luddie Boghosian 07/08/2020  

## 2020-08-16 DIAGNOSIS — N390 Urinary tract infection, site not specified: Secondary | ICD-10-CM | POA: Diagnosis not present

## 2020-11-07 DIAGNOSIS — E559 Vitamin D deficiency, unspecified: Secondary | ICD-10-CM | POA: Diagnosis not present

## 2020-11-07 DIAGNOSIS — Z1151 Encounter for screening for human papillomavirus (HPV): Secondary | ICD-10-CM | POA: Diagnosis not present

## 2020-11-07 DIAGNOSIS — Z0000 Encounter for general adult medical examination without abnormal findings: Secondary | ICD-10-CM | POA: Diagnosis not present

## 2020-11-07 DIAGNOSIS — R87610 Atypical squamous cells of undetermined significance on cytologic smear of cervix (ASC-US): Secondary | ICD-10-CM | POA: Diagnosis not present

## 2020-11-07 DIAGNOSIS — Z8742 Personal history of other diseases of the female genital tract: Secondary | ICD-10-CM | POA: Diagnosis not present

## 2020-11-07 DIAGNOSIS — Z01419 Encounter for gynecological examination (general) (routine) without abnormal findings: Secondary | ICD-10-CM | POA: Diagnosis not present

## 2020-11-07 DIAGNOSIS — Z113 Encounter for screening for infections with a predominantly sexual mode of transmission: Secondary | ICD-10-CM | POA: Diagnosis not present

## 2021-01-08 DIAGNOSIS — Z1231 Encounter for screening mammogram for malignant neoplasm of breast: Secondary | ICD-10-CM | POA: Diagnosis not present

## 2021-03-26 ENCOUNTER — Telehealth: Payer: BC Managed Care – PPO | Admitting: Internal Medicine

## 2021-04-03 ENCOUNTER — Other Ambulatory Visit: Payer: Self-pay

## 2021-04-03 ENCOUNTER — Encounter: Payer: Self-pay | Admitting: Family Medicine

## 2021-04-03 ENCOUNTER — Ambulatory Visit (INDEPENDENT_AMBULATORY_CARE_PROVIDER_SITE_OTHER): Payer: BC Managed Care – PPO | Admitting: Family Medicine

## 2021-04-03 VITALS — BP 140/95 | HR 71 | Ht 66.0 in | Wt 195.5 lb

## 2021-04-03 DIAGNOSIS — Z7689 Persons encountering health services in other specified circumstances: Secondary | ICD-10-CM | POA: Diagnosis not present

## 2021-04-03 DIAGNOSIS — R030 Elevated blood-pressure reading, without diagnosis of hypertension: Secondary | ICD-10-CM | POA: Diagnosis not present

## 2021-04-03 DIAGNOSIS — B369 Superficial mycosis, unspecified: Secondary | ICD-10-CM

## 2021-04-03 DIAGNOSIS — Z0000 Encounter for general adult medical examination without abnormal findings: Secondary | ICD-10-CM | POA: Insufficient documentation

## 2021-04-03 NOTE — Assessment & Plan Note (Signed)
pATIENT in today to establish primary care. She has not had a PCP in sometime but has a GYN that she has yearly exams.  Today her only concern is a baseball size skin irritation on her R leg. It itches, she used steroid cream that did not help.   Plan- Antifungal cream for the infection FU 1 month for PE with Lipid Panel.

## 2021-04-03 NOTE — Progress Notes (Signed)
Established Patient Office Visit  SUBJECTIVE:  Subjective  Patient ID: Brandy Vasquez, female    DOB: [birth year]  Age: 45 y.o. MRN: [id number]  CC:  Chief Complaint  Patient presents with   New Patient (Initial Visit)    Right leg has redness and itchy at times x few months     HPI Brandy Vasquez is a 45 y.o. female presenting today for establishing care and Rash R leg.   History reviewed. No pertinent past medical history.  History reviewed. No pertinent surgical history.  Family History  Problem Relation Age of Onset   Hypertension Mother    Diabetes Mother    Cancer Father     Social History   Socioeconomic History   Marital status: Single    Spouse name: Not on file   Number of children: Not on file   Years of education: Not on file   Highest education level: Not on file  Occupational History   Not on file  Tobacco Use   Smoking status: Never   Smokeless tobacco: Never  Vaping Use   Vaping Use: Never used  Substance and Sexual Activity   Alcohol use: No   Drug use: No   Sexual activity: Not on file  Other Topics Concern   Not on file  Social History Narrative   Not on file   Social Determinants of Health   Financial Resource Strain: Not on file  Food Insecurity: Not on file  Transportation Needs: Not on file  Physical Activity: Not on file  Stress: Not on file  Social Connections: Not on file  Intimate Partner Violence: Not on file    No current outpatient medications on file.   Allergies  Allergen Reactions   Bactrim [Sulfamethoxazole-Trimethoprim]     ROS Review of Systems  Constitutional: Negative.   HENT: Negative.    Respiratory: Negative.    Cardiovascular: Negative.   Genitourinary: Negative.   Musculoskeletal: Negative.   Skin:  Positive for rash.  Psychiatric/Behavioral: Negative.      OBJECTIVE:    Physical Exam Constitutional:      Appearance: She is obese.  HENT:     Right Ear: Tympanic membrane normal.     Left Ear:  Tympanic membrane normal.  Cardiovascular:     Rate and Rhythm: Normal rate and regular rhythm.  Musculoskeletal:        General: Normal range of motion.  Neurological:     Mental Status: She is alert.  Psychiatric:        Mood and Affect: Mood normal.    BP (!) 140/95   Pulse 71   Ht 5\' 6"  (1.676 m)   Wt 195 lb 8 oz (88.7 kg)   BMI 31.55 kg/m  Wt Readings from Last 3 Encounters:  04/03/21 195 lb 8 oz (88.7 kg)  07/06/20 195 lb (88.5 kg)  06/02/19 195 lb (88.5 kg)    Health Maintenance Due  Topic Date Due   COVID-19 Vaccine (1) Never done   HIV Screening  Never done   Hepatitis C Screening  Never done   TETANUS/TDAP  Never done   PAP SMEAR-Modifier  Never done    There are no preventive care reminders to display for this patient.  CBC Latest Ref Rng & Units 02/01/2019  WBC 4.0 - 10.5 K/uL 12.4(H)  Hemoglobin 12.0 - 15.0 g/dL 04/03/2019  Hematocrit 84.1 - 46.0 % 41.4  Platelets 150 - 400 K/uL 275   CMP Latest Ref Rng & Units  02/01/2019  Glucose 70 - 99 mg/dL 87  BUN 6 - 20 mg/dL 10  Creatinine 5.36 - 1.44 mg/dL 3.15  Sodium 400 - 867 mmol/L 138  Potassium 3.5 - 5.1 mmol/L 3.5  Chloride 98 - 111 mmol/L 107  CO2 22 - 32 mmol/L 25  Calcium 8.9 - 10.3 mg/dL 6.1(P)  Total Protein 6.5 - 8.1 g/dL 7.1  Total Bilirubin 0.3 - 1.2 mg/dL 1.1  Alkaline Phos 38 - 126 U/L 88  AST 15 - 41 U/L 23  ALT 0 - 44 U/L 17    No results found for: TSH Lab Results  Component Value Date   ALBUMIN 4.2 02/01/2019   ANIONGAP 6 02/01/2019   No results found for: CHOL, HDL, LDLCALC, CHOLHDL No results found for: TRIG No results found for: HGBA1C    ASSESSMENT & PLAN:   Problem List Items Addressed This Visit       Musculoskeletal and Integument   Fungal infection of skin     Other   Encounter to establish care - Primary    pATIENT in today to establish primary care. She has not had a PCP in sometime but has a GYN that she has yearly exams.  Today her only concern is a baseball  size skin irritation on her R leg. It itches, she used steroid cream that did not help.   Plan- Antifungal cream for the infection FU 1 month for PE with Lipid Panel.        Elevated blood pressure reading    Has not ever been dx with HTN, has occasional h/a especially after eating pork.   Plan- Will have her follow low Na diet and return in 3 months for PE.         No orders of the defined types were placed in this encounter.     Follow-up: No follow-ups on file.    Irish Lack, FNP [facility] [address] [place], [place] [address]

## 2021-04-03 NOTE — Assessment & Plan Note (Signed)
Has not ever been dx with HTN, has occasional h/a especially after eating pork.   Plan- Will have her follow low Na diet and return in 3 months for PE.

## 2021-04-07 ENCOUNTER — Ambulatory Visit (INDEPENDENT_AMBULATORY_CARE_PROVIDER_SITE_OTHER): Payer: BC Managed Care – PPO | Admitting: Family Medicine

## 2021-04-07 DIAGNOSIS — Z7689 Persons encountering health services in other specified circumstances: Secondary | ICD-10-CM

## 2021-04-08 ENCOUNTER — Encounter: Payer: Self-pay | Admitting: Family Medicine

## 2021-04-08 LAB — CBC WITH DIFFERENTIAL/PLATELET
Absolute Monocytes: 541 cells/uL (ref 200–950)
Basophils Absolute: 42 cells/uL (ref 0–200)
Basophils Relative: 0.4 %
Eosinophils Absolute: 180 cells/uL (ref 15–500)
Eosinophils Relative: 1.7 %
HCT: 41.4 % (ref 35.0–45.0)
Hemoglobin: 13.9 g/dL (ref 11.7–15.5)
Lymphs Abs: 2396 cells/uL (ref 850–3900)
MCH: 31.8 pg (ref 27.0–33.0)
MCHC: 33.6 g/dL (ref 32.0–36.0)
MCV: 94.7 fL (ref 80.0–100.0)
MPV: 10.2 fL (ref 7.5–12.5)
Monocytes Relative: 5.1 %
Neutro Abs: 7441 cells/uL (ref 1500–7800)
Neutrophils Relative %: 70.2 %
Platelets: 316 10*3/uL (ref 140–400)
RBC: 4.37 10*6/uL (ref 3.80–5.10)
RDW: 11.2 % (ref 11.0–15.0)
Total Lymphocyte: 22.6 %
WBC: 10.6 10*3/uL (ref 3.8–10.8)

## 2021-04-08 LAB — LIPID PANEL
Cholesterol: 146 mg/dL (ref <200)
HDL: 47 mg/dL — ABNORMAL LOW (ref >=50)
LDL Cholesterol (Calc): 85 mg/dL (calc)
Non-HDL Cholesterol (Calc): 99 mg/dL (calc) (ref <130)
Total CHOL/HDL Ratio: 3.1 (calc) (ref <5.0)
Triglycerides: 65 mg/dL (ref <150)

## 2021-04-08 LAB — COMPLETE METABOLIC PANEL WITH GFR
AG Ratio: 1.7 (calc) (ref 1.0–2.5)
ALT: 13 U/L (ref 6–29)
AST: 15 U/L (ref 10–30)
Albumin: 4.8 g/dL (ref 3.6–5.1)
Alkaline phosphatase (APISO): 83 U/L (ref 31–125)
BUN: 14 mg/dL (ref 7–25)
CO2: 22 mmol/L (ref 20–32)
Calcium: 9.3 mg/dL (ref 8.6–10.2)
Chloride: 105 mmol/L (ref 98–110)
Creat: 0.9 mg/dL (ref 0.50–1.10)
GFR, Est African American: 91 mL/min/{1.73_m2} (ref >=60)
GFR, Est Non African American: 78 mL/min/{1.73_m2} (ref >=60)
Globulin: 2.8 g/dL (calc) (ref 1.9–3.7)
Glucose, Bld: 86 mg/dL (ref 65–99)
Potassium: 3.5 mmol/L (ref 3.5–5.3)
Sodium: 139 mmol/L (ref 135–146)
Total Bilirubin: 1.2 mg/dL (ref 0.2–1.2)
Total Protein: 7.6 g/dL (ref 6.1–8.1)

## 2021-04-08 LAB — TSH: TSH: 3.81 mIU/L

## 2021-05-15 ENCOUNTER — Ambulatory Visit (INDEPENDENT_AMBULATORY_CARE_PROVIDER_SITE_OTHER): Payer: BC Managed Care – PPO | Admitting: Family Medicine

## 2021-05-15 ENCOUNTER — Other Ambulatory Visit: Payer: Self-pay

## 2021-05-15 ENCOUNTER — Encounter: Payer: Self-pay | Admitting: Family Medicine

## 2021-05-15 VITALS — BP 153/111 | HR 78 | Ht 66.0 in | Wt 198.4 lb

## 2021-05-15 DIAGNOSIS — M25512 Pain in left shoulder: Secondary | ICD-10-CM | POA: Diagnosis not present

## 2021-05-15 DIAGNOSIS — I10 Essential (primary) hypertension: Secondary | ICD-10-CM | POA: Diagnosis not present

## 2021-05-15 HISTORY — DX: Essential (primary) hypertension: I10

## 2021-05-15 MED ORDER — LISINOPRIL-HYDROCHLOROTHIAZIDE 10-12.5 MG PO TABS: 1.0000 | ORAL_TABLET | Freq: Every day | ORAL | 3 refills | Status: DC

## 2021-05-15 MED ORDER — MELOXICAM 15 MG PO TABS: 15.0000 mg | ORAL_TABLET | Freq: Every day | ORAL | 0 refills | Status: DC

## 2021-05-15 NOTE — Progress Notes (Signed)
Established Patient Office Visit  SUBJECTIVE:  Subjective  Patient ID: Brandy Vasquez, female    DOB: 1976-06-21  Age: 45 y.o. MRN: [id number]  CC:  Chief Complaint  Patient presents with   Hip Pain    Patient complains of both hips hurting.   Shoulder Pain    Patient complains of left shoulder pain x 3 weeks.    HPI Brandy Vasquez is a 45 y.o. female presenting today for     History reviewed. No pertinent past medical history.  History reviewed. No pertinent surgical history.  Family History  Problem Relation Age of Onset   Hypertension Mother    Diabetes Mother    Cancer Father     Social History   Socioeconomic History   Marital status: Single    Spouse name: Not on file   Number of children: Not on file   Years of education: Not on file   Highest education level: Not on file  Occupational History   Not on file  Tobacco Use   Smoking status: Never   Smokeless tobacco: Never  Vaping Use   Vaping Use: Never used  Substance and Sexual Activity   Alcohol use: No   Drug use: No   Sexual activity: Not on file  Other Topics Concern   Not on file  Social History Narrative   Not on file   Social Determinants of Health   Financial Resource Strain: Not on file  Food Insecurity: Not on file  Transportation Needs: Not on file  Physical Activity: Not on file  Stress: Not on file  Social Connections: Not on file  Intimate Partner Violence: Not on file     Current Outpatient Medications:    lisinopril-hydrochlorothiazide (ZESTORETIC) 10-12.5 MG tablet, Take 1 tablet by mouth daily., Disp: 90 tablet, Rfl: 3   meloxicam (MOBIC) 15 MG tablet, Take 1 tablet (15 mg total) by mouth daily., Disp: 30 tablet, Rfl: 0   Allergies  Allergen Reactions   Bactrim [Sulfamethoxazole-Trimethoprim]     ROS Review of Systems  Constitutional: Negative.   HENT: Negative.    Respiratory: Negative.    Cardiovascular: Negative.   Genitourinary: Negative.    Psychiatric/Behavioral: Negative.      OBJECTIVE:    Physical Exam HENT:     Head: Normocephalic.  Cardiovascular:     Rate and Rhythm: Normal rate.  Abdominal:     General: Abdomen is flat.  Musculoskeletal:        General: No signs of injury.  Neurological:     Mental Status: She is alert.    BP (!) 153/111   Pulse 78   Ht 5\' 6"  (1.676 m)   Wt 198 lb 6.4 oz (90 kg)   BMI 32.02 kg/m  Wt Readings from Last 3 Encounters:  05/15/21 198 lb 6.4 oz (90 kg)  04/03/21 195 lb 8 oz (88.7 kg)  07/06/20 195 lb (88.5 kg)    Health Maintenance Due  Topic Date Due   COVID-19 Vaccine (1) Never done   HIV Screening  Never done   Hepatitis C Screening  Never done   TETANUS/TDAP  Never done   PAP SMEAR-Modifier  Never done    There are no preventive care reminders to display for this patient.  CBC Latest Ref Rng & Units 04/07/2021 02/01/2019  WBC 3.8 - 10.8 Thousand/uL 10.6 12.4(H)  Hemoglobin 11.7 - 15.5 g/dL 04/03/2019 58.8  Hematocrit 50.2 - 45.0 % 41.4 41.4  Platelets 140 - 400 Thousand/uL 316  275   CMP Latest Ref Rng & Units 04/07/2021 02/01/2019  Glucose 65 - 99 mg/dL 86 87  BUN 7 - 25 mg/dL 14 10  Creatinine 9.39 - 1.10 mg/dL 0.30 0.92  Sodium 330 - 146 mmol/L 139 138  Potassium 3.5 - 5.3 mmol/L 3.5 3.5  Chloride 98 - 110 mmol/L 105 107  CO2 20 - 32 mmol/L 22 25  Calcium 8.6 - 10.2 mg/dL 9.3 0.7(M)  Total Protein 6.1 - 8.1 g/dL 7.6 7.1  Total Bilirubin 0.2 - 1.2 mg/dL 1.2 1.1  Alkaline Phos 38 - 126 U/L - 88  AST 10 - 30 U/L 15 23  ALT 6 - 29 U/L 13 17    Lab Results  Component Value Date   TSH 3.81 04/07/2021   Lab Results  Component Value Date   ALBUMIN 4.2 02/01/2019   ANIONGAP 6 02/01/2019   Lab Results  Component Value Date   CHOL 146 04/07/2021   HDL 47 (L) 04/07/2021   LDLCALC 85 04/07/2021   CHOLHDL 3.1 04/07/2021   Lab Results  Component Value Date   TRIG 65 04/07/2021   No results found for: HGBA1C    ASSESSMENT & PLAN:   Problem List Items  Addressed This Visit       Cardiovascular and Mediastinum   Primary hypertension - Primary    Elevated BP for some time now and has been trying to control with diet.  Plan- Starting Ace and HCTZ today, fu 2 weeks.        Relevant Medications   lisinopril-hydrochlorothiazide (ZESTORETIC) 10-12.5 MG tablet     Other   Acute pain of left shoulder    Pain left shoulder has been hurting for 3 weeks, rom wnl but painfull to move as well as her R hip recently. No trauma.  Plan- Meloxicam 15 mg daily with FU.         Meds ordered this encounter  Medications   meloxicam (MOBIC) 15 MG tablet    Sig: Take 1 tablet (15 mg total) by mouth daily.    Dispense:  30 tablet    Refill:  0   lisinopril-hydrochlorothiazide (ZESTORETIC) 10-12.5 MG tablet    Sig: Take 1 tablet by mouth daily.    Dispense:  90 tablet    Refill:  3      Follow-up: No follow-ups on file.    Irish Lack, FNP [facility] [address] [place], [place] [address]

## 2021-05-15 NOTE — Assessment & Plan Note (Signed)
Pain left shoulder has been hurting for 3 weeks, rom wnl but painfull to move as well as her R hip recently. No trauma.  Plan- Meloxicam 15 mg daily with FU.

## 2021-05-15 NOTE — Assessment & Plan Note (Signed)
Elevated BP for some time now and has been trying to control with diet.  Plan- Starting Ace and HCTZ today, fu 2 weeks.

## 2021-05-27 ENCOUNTER — Other Ambulatory Visit: Payer: Self-pay

## 2021-05-27 ENCOUNTER — Encounter: Payer: Self-pay | Admitting: Internal Medicine

## 2021-05-27 ENCOUNTER — Ambulatory Visit: Payer: BC Managed Care – PPO | Admitting: Internal Medicine

## 2021-05-27 VITALS — BP 114/82 | HR 71 | Ht 67.0 in | Wt 196.6 lb

## 2021-05-27 DIAGNOSIS — B369 Superficial mycosis, unspecified: Secondary | ICD-10-CM

## 2021-05-27 DIAGNOSIS — E8881 Metabolic syndrome: Secondary | ICD-10-CM | POA: Insufficient documentation

## 2021-05-27 DIAGNOSIS — M12812 Other specific arthropathies, not elsewhere classified, left shoulder: Secondary | ICD-10-CM | POA: Diagnosis not present

## 2021-05-27 DIAGNOSIS — I10 Essential (primary) hypertension: Secondary | ICD-10-CM | POA: Diagnosis not present

## 2021-05-27 NOTE — Assessment & Plan Note (Signed)

## 2021-05-27 NOTE — Assessment & Plan Note (Signed)

## 2021-05-27 NOTE — Assessment & Plan Note (Signed)
Rotator cuff exercises were explained to the patient.  She was advised to also look on YouTube and do some exercises as suggested by the physical therapy program.

## 2021-05-27 NOTE — Assessment & Plan Note (Signed)
Stable at the present time. 

## 2021-05-27 NOTE — Progress Notes (Signed)
Established Patient Office Visit  SUBJECTIVE:  Subjective  Patient ID: Brandy Vasquez, female    DOB: 1976/02/14  Age: 45 y.o. MRN: [id number]  CC:  Chief Complaint  Patient presents with   Hypertension    Patient is here for Bp follow up    HPI Brandy Vasquez is a 45 y.o. female presenting today for  check up   I advised the patient to follow Mediterranean diet This diet is rich in fruits vegetables and whole grain, and This diet is also rich in fish and lean meat Patient should also eat a handful of almonds or walnuts daily Recent heart study indicated that average follow-up on this kind of diet reduces the cardiovascular mortality by 50 to 70%== blood pressure lower respiratory walking but has CAD.    History reviewed. No pertinent past medical history.  History reviewed. No pertinent surgical history.  Family History  Problem Relation Age of Onset   Hypertension Mother    Diabetes Mother    Cancer Father     Social History   Socioeconomic History   Marital status: Single    Spouse name: Not on file   Number of children: Not on file   Years of education: Not on file   Highest education level: Not on file  Occupational History   Not on file  Tobacco Use   Smoking status: Never   Smokeless tobacco: Never  Vaping Use   Vaping Use: Never used  Substance and Sexual Activity   Alcohol use: No   Drug use: No   Sexual activity: Not on file  Other Topics Concern   Not on file  Social History Narrative   Not on file   Social Determinants of Health   Financial Resource Strain: Not on file  Food Insecurity: Not on file  Transportation Needs: Not on file  Physical Activity: Not on file  Stress: Not on file  Social Connections: Not on file  Intimate Partner Violence: Not on file     Current Outpatient Medications:    meloxicam (MOBIC) 15 MG tablet, Take 1 tablet (15 mg total) by mouth daily., Disp: 30 tablet, Rfl: 0   Allergies  Allergen Reactions    Bactrim [Sulfamethoxazole-Trimethoprim]     ROS Review of Systems  Constitutional: Negative.   HENT: Negative.    Respiratory: Negative.    Cardiovascular: Negative.   Genitourinary: Negative.   Psychiatric/Behavioral: Negative.      OBJECTIVE:    Physical Exam HENT:     Head: Normocephalic.  Cardiovascular:     Rate and Rhythm: Normal rate.  Abdominal:     General: Abdomen is flat.  Musculoskeletal:        General: No signs of injury.  Neurological:     Mental Status: She is alert.    BP 114/82   Pulse 71   Ht 5\' 7"  (1.702 m)   Wt 196 lb 9.6 oz (89.2 kg)   BMI 30.79 kg/m  Wt Readings from Last 3 Encounters:  05/27/21 196 lb 9.6 oz (89.2 kg)  05/15/21 198 lb 6.4 oz (90 kg)  04/03/21 195 lb 8 oz (88.7 kg)    Health Maintenance Due  Topic Date Due   HIV Screening  Never done   Hepatitis C Screening  Never done   TETANUS/TDAP  Never done   PAP SMEAR-Modifier  Never done   COVID-19 Vaccine (2 - Moderna series) 07/25/2020   INFLUENZA VACCINE  05/25/2021    There are no preventive  care reminders to display for this patient.  CBC Latest Ref Rng & Units 04/07/2021 02/01/2019  WBC 3.8 - 10.8 Thousand/uL 10.6 12.4(H)  Hemoglobin 11.7 - 15.5 g/dL 16.1 09.6  Hematocrit 04.5 - 45.0 % 41.4 41.4  Platelets 140 - 400 Thousand/uL 316 275   CMP Latest Ref Rng & Units 04/07/2021 02/01/2019  Glucose 65 - 99 mg/dL 86 87  BUN 7 - 25 mg/dL 14 10  Creatinine 4.09 - 1.10 mg/dL 8.11 9.14  Sodium 782 - 146 mmol/L 139 138  Potassium 3.5 - 5.3 mmol/L 3.5 3.5  Chloride 98 - 110 mmol/L 105 107  CO2 20 - 32 mmol/L 22 25  Calcium 8.6 - 10.2 mg/dL 9.3 9.5(A)  Total Protein 6.1 - 8.1 g/dL 7.6 7.1  Total Bilirubin 0.2 - 1.2 mg/dL 1.2 1.1  Alkaline Phos 38 - 126 U/L - 88  AST 10 - 30 U/L 15 23  ALT 6 - 29 U/L 13 17    Lab Results  Component Value Date   TSH 3.81 04/07/2021   Lab Results  Component Value Date   ALBUMIN 4.2 02/01/2019   ANIONGAP 6 02/01/2019   Lab Results   Component Value Date   CHOL 146 04/07/2021   HDL 47 (L) 04/07/2021   LDLCALC 85 04/07/2021   CHOLHDL 3.1 04/07/2021   Lab Results  Component Value Date   TRIG 65 04/07/2021   No results found for: HGBA1C    ASSESSMENT & PLAN:   Problem List Items Addressed This Visit       Cardiovascular and Mediastinum   Primary hypertension - Primary     Patient denies any chest pain or shortness of breath there is no history of palpitation or paroxysmal nocturnal dyspnea   patient was advised to follow low-salt low-cholesterol diet    ideally I want to keep systolic blood pressure below 213 mmHg, patient was asked to check blood pressure one times a week and give me a report on that.  Patient will be follow-up in 3 months  or earlier as needed, patient will call me back for any change in the cardiovascular symptoms Patient was advised to buy a book from local bookstore concerning blood pressure and read several chapters  every day.  This will be supplemented by some of the material we will give him from the office.  Patient should also utilize other resources like YouTube and Internet to learn more about the blood pressure and the diet.         Musculoskeletal and Integument   Fungal infection of skin    Stable at the present time       Rotator cuff arthropathy of left shoulder    Rotator cuff exercises were explained to the patient.  She was advised to also look on YouTube and do some exercises as suggested by the physical therapy program.         Other   Metabolic syndrome    - I encouraged the patient to lose weight.  - I educated them on making healthy dietary choices including eating more fruits and vegetables and less fried foods. - I encouraged the patient to exercise more, and educated on the benefits of exercise including weight loss, diabetes prevention, and hypertension prevention.   Dietary counseling with a registered dietician  Referral to a weight management support  group (e.g. Weight Watchers, Overeaters Anonymous)  If your BMI is greater than 29 or you have gained more than 15 pounds you should work  on weight loss.  Attend a healthy cooking class        No orders of the defined types were placed in this encounter.     Follow-up: Patient complaining of left shoulder pain shoulder rotator cuff exercises were explained to her.  She was advised to lose weight.  She is going to stop her blood pressure medication because her blood pressure is okay.  Continue using Mobic as needed for the shoulder pain.   Corky Downs, MD [facility] [address] [place], [place] [address]

## 2021-07-20 ENCOUNTER — Other Ambulatory Visit: Payer: Self-pay | Admitting: *Deleted

## 2021-07-20 MED ORDER — MELOXICAM 15 MG PO TABS
15.0000 mg | ORAL_TABLET | Freq: Every day | ORAL | 0 refills | Status: DC
Start: 2021-07-20 — End: 2021-08-25

## 2021-07-26 NOTE — Progress Notes (Deleted)
  Subjective:    Brandy Vasquez - 45 y.o. female MRN [id number]  Date of birth: 1976/01/15  HPI  Brandy Vasquez is to establish care.  Current issues and/or concerns:  ROS per HPI     Health Maintenance:  - *** Health Maintenance Due  Topic Date Due   HIV Screening  Never done   Hepatitis C Screening  Never done   TETANUS/TDAP  Never done   PAP SMEAR-Modifier  Never done   COVID-19 Vaccine (2 - Moderna series) 07/25/2020   INFLUENZA VACCINE  Never done     Past Medical History: Patient Active Problem List   Diagnosis Date Noted   Metabolic syndrome 05/27/2021   Rotator cuff arthropathy of left shoulder 05/27/2021   Primary hypertension 05/15/2021   Acute pain of left shoulder 05/15/2021   Encounter to establish care 04/03/2021   Fungal infection of skin 04/03/2021   Elevated blood pressure reading 04/03/2021      Social History   reports that she has never smoked. She has never used smokeless tobacco. She reports that she does not drink alcohol and does not use drugs.   Family History  family history includes Cancer in her father; Diabetes in her mother; Hypertension in her mother.   Medications: reviewed and updated   Objective:   Physical Exam There were no vitals taken for this visit. Physical Exam      Assessment & Plan:         Patient was given clear instructions to go to Emergency Department or return to medical center if symptoms don't improve, worsen, or new problems develop.The patient verbalized understanding.  I discussed the assessment and treatment plan with the patient. The patient was provided an opportunity to ask questions and all were answered. The patient agreed with the plan and demonstrated an understanding of the instructions.   The patient was advised to call back or seek an in-person evaluation if the symptoms worsen or if the condition fails to improve as anticipated.    Ricky Stabs, NP 07/26/2021, 10:55 AM Primary Care at  [facility]

## 2021-07-30 ENCOUNTER — Ambulatory Visit: Payer: BC Managed Care – PPO | Admitting: Family

## 2021-07-30 DIAGNOSIS — Z7689 Persons encountering health services in other specified circumstances: Secondary | ICD-10-CM

## 2021-08-04 ENCOUNTER — Encounter: Payer: Self-pay | Admitting: Family Medicine

## 2021-08-04 ENCOUNTER — Other Ambulatory Visit: Payer: Self-pay

## 2021-08-04 ENCOUNTER — Ambulatory Visit: Payer: BC Managed Care – PPO | Admitting: Family Medicine

## 2021-08-04 ENCOUNTER — Other Ambulatory Visit: Payer: Self-pay | Admitting: Family Medicine

## 2021-08-04 VITALS — BP 136/68 | HR 73 | Ht 67.0 in | Wt 198.0 lb

## 2021-08-04 DIAGNOSIS — N3091 Cystitis, unspecified with hematuria: Secondary | ICD-10-CM

## 2021-08-04 DIAGNOSIS — R3915 Urgency of urination: Secondary | ICD-10-CM

## 2021-08-04 DIAGNOSIS — N3090 Cystitis, unspecified without hematuria: Secondary | ICD-10-CM | POA: Insufficient documentation

## 2021-08-04 DIAGNOSIS — M62838 Other muscle spasm: Secondary | ICD-10-CM

## 2021-08-04 LAB — POCT URINALYSIS DIPSTICK
Bilirubin, UA: NEGATIVE
Glucose, UA: NEGATIVE
Ketones, UA: NEGATIVE
Nitrite, UA: NEGATIVE
Protein, UA: POSITIVE — AB
Spec Grav, UA: 1.015 (ref 1.010–1.025)
Urobilinogen, UA: 0.2 E.U./dL
pH, UA: 5 (ref 5.0–8.0)

## 2021-08-04 MED ORDER — NITROFURANTOIN MONOHYD MACRO 100 MG PO CAPS: 100.0000 mg | ORAL_CAPSULE | Freq: Two times a day (BID) | ORAL | 0 refills | Status: DC

## 2021-08-04 MED ORDER — CYCLOBENZAPRINE HCL 5 MG PO TABS: 5.0000 mg | ORAL_TABLET | Freq: Every evening | ORAL | 0 refills | Status: DC | PRN

## 2021-08-04 NOTE — Progress Notes (Signed)
Primary Care / Sports Medicine Office Visit  Patient Information:  Patient ID: Brandy Vasquez, female DOB: 07/28/1976 Age: 45 y.o. MRN: [id number]   Brandy Vasquez is a pleasant 45 y.o. female presenting with the following:  Chief Complaint  Patient presents with   New Patient (Initial Visit)   Establish Care   Urinary Tract Infection    Urgency. Started 2 days ago. Spotting, but no pain.     Review of Systems  Genitourinary:  Positive for hematuria and urgency.  pertinent details above   Patient Active Problem List   Diagnosis Date Noted   Hemorrhagic cystitis 08/04/2021   Cervical paraspinal muscle spasm 08/04/2021   Metabolic syndrome 05/27/2021   Rotator cuff arthropathy of left shoulder 05/27/2021   Primary hypertension 05/15/2021   Acute pain of left shoulder 05/15/2021   Encounter to establish care 04/03/2021   Fungal infection of skin 04/03/2021   Elevated blood pressure reading 04/03/2021   Past Medical History:  Diagnosis Date   Herpes    Hypertension    Outpatient Encounter Medications as of 08/04/2021  Medication Sig   cyclobenzaprine (FLEXERIL) 5 MG tablet Take 1 tablet (5 mg total) by mouth at bedtime as needed for muscle spasms.   levonorgestrel (MIRENA) 20 MCG/DAY IUD by Intrauterine route.   meloxicam (MOBIC) 15 MG tablet Take 1 tablet (15 mg total) by mouth daily.   nitrofurantoin, macrocrystal-monohydrate, (MACROBID) 100 MG capsule Take 1 capsule (100 mg total) by mouth 2 (two) times daily.   No facility-administered encounter medications on file as of 08/04/2021.   Past Surgical History:  Procedure Laterality Date   BREAST BIOPSY Left 2016    Vitals:   08/04/21 1532  BP: 136/68  Pulse: 73  SpO2: 98%   Vitals:   08/04/21 1532  Weight: 198 lb (89.8 kg)  Height: 5\' 7"  (1.702 m)   Body mass index is 31.01 kg/m.  No results found.   Independent interpretation of notes and tests performed by another provider:   None  Procedures  performed:   None  Pertinent History, Exam, Impression, and Recommendations:   Hemorrhagic cystitis Patient with clinical history and UA findings consistent with hemorrhagic cystitis.  Macrobid was written for and urine was sent for culture analysis.  We can modify treatment once labs have resulted.  Supportive measures outlined over the interim as well as monitoring for yeast infection.  Cervical paraspinal muscle spasm Clinical history and findings are consistent with paraspinal cervical muscular spasm.  This is a chronic issue with recurrent exacerbation.  Examination is consistent with significant paraspinal cervical spasm with preserved sensorimotor function in bilateral upper extremities, Spurling's testing is negative.  Concern for underlying cervical spondyloarthropathy. I have advised patient of the same as well as various treatment strategies and she will initiate skeletal muscle relaxer, will maintain follow-up at annual physical.    Orders & Medications Meds ordered this encounter  Medications   nitrofurantoin, macrocrystal-monohydrate, (MACROBID) 100 MG capsule    Sig: Take 1 capsule (100 mg total) by mouth 2 (two) times daily.    Dispense:  14 capsule    Refill:  0   cyclobenzaprine (FLEXERIL) 5 MG tablet    Sig: Take 1 tablet (5 mg total) by mouth at bedtime as needed for muscle spasms.    Dispense:  14 tablet    Refill:  0   Orders Placed This Encounter  Procedures   Urine Culture   POCT Urinalysis Dipstick     Return  in about 3 weeks (around 08/25/2021) for annual physical.     Jerrol Banana, MD   Primary Care Sports Medicine [facility]

## 2021-08-04 NOTE — Patient Instructions (Signed)
-   Dose Macrobid twice daily x7 days - If noting any whitish discharge/yeast symptoms, contact our office - Drink plenty of water to stay adequately hydrated - Can consider probiotic over-the-counter supplement if you notice stomach symptoms - Continue current medications otherwise next-after antibiotics complete, start nightly cyclobenzaprine for neck/shoulder and hip/low back symptoms - Return for follow-up in 3-4 weeks

## 2021-08-07 LAB — URINE CULTURE

## 2021-08-10 NOTE — Assessment & Plan Note (Signed)
Patient with clinical history and UA findings consistent with hemorrhagic cystitis.  Macrobid was written for and urine was sent for culture analysis.  We can modify treatment once labs have resulted.  Supportive measures outlined over the interim as well as monitoring for yeast infection.

## 2021-08-10 NOTE — Assessment & Plan Note (Addendum)
Clinical history and findings are consistent with paraspinal cervical muscular spasm.  This is a chronic issue with recurrent exacerbation.  Examination is consistent with significant paraspinal cervical spasm with preserved sensorimotor function in bilateral upper extremities, Spurling's testing is negative.  Concern for underlying cervical spondyloarthropathy. I have advised patient of the same as well as various treatment strategies and she will initiate skeletal muscle relaxer, will maintain follow-up at annual physical.

## 2021-08-25 ENCOUNTER — Other Ambulatory Visit: Payer: Self-pay | Admitting: Internal Medicine

## 2021-08-26 ENCOUNTER — Encounter: Payer: Self-pay | Admitting: Family Medicine

## 2021-08-26 ENCOUNTER — Other Ambulatory Visit: Payer: Self-pay

## 2021-08-26 ENCOUNTER — Ambulatory Visit (INDEPENDENT_AMBULATORY_CARE_PROVIDER_SITE_OTHER): Payer: BC Managed Care – PPO | Admitting: Family Medicine

## 2021-08-26 VITALS — BP 112/84 | HR 56 | Temp 98.2°F | Ht 67.0 in | Wt 199.0 lb

## 2021-08-26 DIAGNOSIS — M79642 Pain in left hand: Secondary | ICD-10-CM

## 2021-08-26 DIAGNOSIS — L0292 Furuncle, unspecified: Secondary | ICD-10-CM

## 2021-08-26 DIAGNOSIS — Z114 Encounter for screening for human immunodeficiency virus [HIV]: Secondary | ICD-10-CM

## 2021-08-26 DIAGNOSIS — M79641 Pain in right hand: Secondary | ICD-10-CM

## 2021-08-26 DIAGNOSIS — Z8261 Family history of arthritis: Secondary | ICD-10-CM | POA: Diagnosis not present

## 2021-08-26 DIAGNOSIS — Z1159 Encounter for screening for other viral diseases: Secondary | ICD-10-CM

## 2021-08-26 DIAGNOSIS — Z23 Encounter for immunization: Secondary | ICD-10-CM

## 2021-08-26 DIAGNOSIS — E559 Vitamin D deficiency, unspecified: Secondary | ICD-10-CM | POA: Diagnosis not present

## 2021-08-26 DIAGNOSIS — Z0000 Encounter for general adult medical examination without abnormal findings: Secondary | ICD-10-CM

## 2021-08-26 DIAGNOSIS — M62838 Other muscle spasm: Secondary | ICD-10-CM

## 2021-08-26 NOTE — Assessment & Plan Note (Signed)
Chronic issue with slight improvement following medications, I have advised home exercises which were provided to the patient today, continued medication usage, and to contact us for recalcitrant symptomatology.

## 2021-08-26 NOTE — Progress Notes (Signed)
Annual Physical Exam Visit  Patient Information:  Patient ID: Brandy Vasquez, female DOB: May 15, 1976 Age: 45 y.o. MRN: [id number]   Subjective:   CC: Annual Physical Exam  HPI:  Brandy Vasquez is here for their annual physical.  I reviewed the past medical history, family history, social history, surgical history, and allergies today and changes were made as necessary.  Please see the problem list section below for additional details.  Past Medical History: Past Medical History:  Diagnosis Date   Herpes    Hypertension    Past Surgical History: Past Surgical History:  Procedure Laterality Date   BREAST BIOPSY Left 2016   Family History: Family History  Problem Relation Age of Onset   Hypertension Mother    Diabetes Mother    Rheum arthritis Mother    Cancer Father    ADD / ADHD Daughter    ADD / ADHD Daughter    Alzheimer's disease Maternal Grandmother    Stroke Maternal Grandfather    Ovarian cancer Paternal Grandmother    Allergies: Allergies  Allergen Reactions   Bactrim [Sulfamethoxazole-Trimethoprim] Rash   Health Maintenance: Health Maintenance  Topic Date Due   Hepatitis C Screening  Never done   COVID-19 Vaccine (2 - Moderna series) 07/25/2020   INFLUENZA VACCINE  01/22/2022 (Originally 05/25/2021)   PAP SMEAR-Modifier  11/08/2023   TETANUS/TDAP  08/27/2031   HIV Screening  Completed   Pneumococcal Vaccine 41-21 Years old  Aged Out   HPV VACCINES  Aged Out    HM Colonoscopy     This patient has no relevant Health Maintenance data.      Medications: Current Outpatient Medications on File Prior to Visit  Medication Sig Dispense Refill   cyclobenzaprine (FLEXERIL) 5 MG tablet Take 1 tablet (5 mg total) by mouth at bedtime as needed for muscle spasms. 14 tablet 0   levonorgestrel (MIRENA) 20 MCG/DAY IUD 1 each by Intrauterine route once.     meloxicam (MOBIC) 15 MG tablet TAKE 1 TABLET (15 MG TOTAL) BY MOUTH DAILY. 30 tablet 0   No current  facility-administered medications on file prior to visit.    Review of Systems: No headache, visual changes, nausea, vomiting, diarrhea, constipation, dizziness, abdominal pain, skin rash, fevers, chills, night sweats, swollen lymph nodes, weight loss, chest pain, body aches, joint swelling, muscle aches, shortness of breath, mood changes, visual or auditory hallucinations reported.  Objective:   Vitals:   08/26/21 0953  BP: 112/84  Pulse: (!) 56  Temp: 98.2 F (36.8 C)  SpO2: 97%   Vitals:   08/26/21 0953  Weight: 199 lb (90.3 kg)  Height: 5\' 7"  (1.702 m)   Body mass index is 31.17 kg/m.  General: Well Developed, well nourished, and in no acute distress.  Neuro: Alert and oriented x3, extra-ocular muscles intact, sensation grossly intact. Cranial nerves II through XII are grossly intact, motor, sensory, and coordinative functions are intact. HEENT: Normocephalic, atraumatic, pupils equal round reactive to light, neck supple, no masses, no lymphadenopathy, thyroid nonpalpable. Oropharynx, nasopharynx, external ear canals are unremarkable. Skin: Warm and dry, no rashes noted.  There is a roughly 3 x 3 furuncle at the right upper trapezius medially Cardiac: Regular rate and rhythm, no murmurs rubs or gallops. No peripheral edema. Pulses symmetric. Respiratory: Clear to auscultation bilaterally. Not using accessory muscles, speaking in full sentences.  Abdominal: Soft, nontender, nondistended, positive bowel sounds, no masses, no organomegaly. Musculoskeletal: Shoulder, elbow, wrist, hip, knee, ankle stable, and with full  range of motion.  Female chaperone initials: BN present throughout the physical examination.  Impression and Recommendations:   The patient was counselled, risk factors were discussed, and anticipatory guidance given.  Furuncle Incidentally noted during examination. Per patient, this has been present for years. Referral to dermatology placed for further  evaluation and management.    Annual physical exam Annual examination completed, risk stratification labs ordered, anticipatory guidance provided.  We will follow labs once resulted.  Tdap administered today.   Cervical paraspinal muscle spasm Chronic issue with slight improvement following medications, I have advised home exercises which were provided to the patient today, continued medication usage, and to contact us for recalcitrant symptomatology.  Orders & Medications Medications: No orders of the defined types were placed in this encounter.  Orders Placed This Encounter  Procedures   DG Hand Complete Right   DG Wrist Complete Right   DG Wrist Complete Left   DG Hand Complete Left   DG Cervical Spine Complete   Tdap vaccine greater than or equal to 7yo IM   ANA 12Plus Profile, Do All RDL   VITAMIN D 25 Hydroxy (Vit-D Deficiency, Fractures)   Hepatitis C Antibody   HIV antibody (with reflex)   Ambulatory referral to Dermatology     Return in about 1 year (around 08/26/2022).    Brandy Banana, MD   Primary Care Sports Medicine [facility]

## 2021-08-26 NOTE — Patient Instructions (Signed)
-   Obtain fasting labs with orders provided (can have water or black coffee but otherwise no food or drink x 8 hours before labs) °- Review information provided °- Attend eye doctor annually, dentist every 6 months, work towards or maintain 30 minutes of moderate intensity physical activity at least 5 days per week, and consume a balanced diet °- Return in 1 year for physical °- Contact us for any questions between now and then °

## 2021-08-26 NOTE — Assessment & Plan Note (Signed)
Annual examination completed, risk stratification labs ordered, anticipatory guidance provided.  We will follow labs once resulted.  Tdap administered today. 

## 2021-08-26 NOTE — Assessment & Plan Note (Signed)
Incidentally noted during examination. Per patient, this has been present for years. Referral to dermatology placed for further evaluation and management.

## 2021-09-03 ENCOUNTER — Telehealth: Payer: Self-pay

## 2021-09-03 DIAGNOSIS — H6123 Impacted cerumen, bilateral: Secondary | ICD-10-CM | POA: Diagnosis not present

## 2021-09-03 DIAGNOSIS — S00411A Abrasion of right ear, initial encounter: Secondary | ICD-10-CM | POA: Diagnosis not present

## 2021-09-03 NOTE — Telephone Encounter (Signed)
Left message notifying patient she does not need an appointment to schedule X-Rays and several have been ordered for her to complete at anytime she is ready.  Patient will need a follow-up with Dr. Ashley Royalty after getting X-Rays also.  For your information.

## 2021-09-03 NOTE — Telephone Encounter (Signed)
Copied from CRM [id number]. Topic: General - Call Back - No Documentation >> Sep 02, 2021  4:23 PM Randol Kern wrote: Reason for CRM: Pt returned call regarding Xray scheduling, says she missed call from the office. please advise

## 2021-09-08 ENCOUNTER — Encounter: Payer: BC Managed Care – PPO | Admitting: Internal Medicine

## 2021-09-09 ENCOUNTER — Encounter: Payer: Self-pay | Admitting: Family Medicine

## 2021-09-09 ENCOUNTER — Other Ambulatory Visit: Payer: Self-pay | Admitting: Family Medicine

## 2021-09-09 DIAGNOSIS — Z8261 Family history of arthritis: Secondary | ICD-10-CM

## 2021-09-09 DIAGNOSIS — M2550 Pain in unspecified joint: Secondary | ICD-10-CM

## 2021-09-09 LAB — VITAMIN D 25 HYDROXY (VIT D DEFICIENCY, FRACTURES): Vit D, 25-Hydroxy: 24.6 ng/mL — ABNORMAL LOW (ref 30.0–100.0)

## 2021-09-09 LAB — ANA 12PLUS PROFILE, DO ALL RDL
Anti-CCP Ab, IgG & IgA (RDL): >250 Units — ABNORMAL HIGH (ref <20)
Anti-Cardiolipin Ab, IgA (RDL): <12 APL U/mL (ref <12)
Anti-Cardiolipin Ab, IgG (RDL): <15 GPL U/mL (ref <15)
Anti-Cardiolipin Ab, IgM (RDL): <13 MPL U/mL (ref <13)
Anti-Centromere Ab (RDL): <1:40 {titer}
Anti-Chromatin Ab, IgG (RDL): <20 Units (ref <20)
Anti-La (SS-B) Ab (RDL): <20 Units (ref <20)
Anti-Nuclear Ab by IFA (RDL): POSITIVE — AB
Anti-Ro (SS-A) Ab (RDL): <20 Units (ref <20)
Anti-Scl-70 Ab (RDL): <20 Units (ref <20)
Anti-Sm Ab (RDL): <20 Units (ref <20)
Anti-TPO Ab (RDL): <9 IU/mL (ref <9.0)
Anti-U1 RNP Ab (RDL): <20 Units (ref <20)
Anti-dsDNA Ab by Farr(RDL): <8 IU/mL (ref <8.0)
C3 Complement (RDL): 131 mg/dL (ref 82–167)
C4 Complement (RDL): 24 mg/dL (ref 14–44)
Rheumatoid Factor by Turb RDL: 61 IU/mL — ABNORMAL HIGH (ref <14)

## 2021-09-09 LAB — HEPATITIS C ANTIBODY: Hep C Virus Ab: <0.1 s/co ratio (ref 0.0–0.9)

## 2021-09-09 LAB — HIV ANTIBODY (ROUTINE TESTING W REFLEX): HIV Screen 4th Generation wRfx: NONREACTIVE

## 2021-09-09 LAB — ANA TITER AND PATTERN: Homogeneous Pattern: 1:80 {titer} — ABNORMAL HIGH

## 2021-09-10 ENCOUNTER — Encounter: Payer: Self-pay | Admitting: Family Medicine

## 2021-09-10 DIAGNOSIS — E559 Vitamin D deficiency, unspecified: Secondary | ICD-10-CM

## 2021-09-10 MED ORDER — VITAMIN D (ERGOCALCIFEROL) 1.25 MG (50000 UNIT) PO CAPS: 50000.0000 [IU] | ORAL_CAPSULE | ORAL | 0 refills | Status: DC

## 2021-09-15 ENCOUNTER — Ambulatory Visit
Admission: RE | Admit: 2021-09-15 | Discharge: 2021-09-15 | Disposition: A | Discharge status: Home / Self Care | Payer: BC Managed Care – PPO | Source: Other Acute Inpatient Hospital | Attending: Family Medicine | Admitting: Family Medicine

## 2021-09-15 ENCOUNTER — Ambulatory Visit
Admission: RE | Admit: 2021-09-15 | Discharge: 2021-09-15 | Disposition: A | Discharge status: Home / Self Care | Payer: BC Managed Care – PPO | Source: Ambulatory Visit | Attending: Family Medicine | Admitting: Family Medicine

## 2021-09-15 ENCOUNTER — Other Ambulatory Visit: Payer: Self-pay

## 2021-09-15 DIAGNOSIS — M79642 Pain in left hand: Secondary | ICD-10-CM | POA: Diagnosis not present

## 2021-09-15 DIAGNOSIS — M79641 Pain in right hand: Secondary | ICD-10-CM | POA: Diagnosis not present

## 2021-09-15 DIAGNOSIS — M542 Cervicalgia: Secondary | ICD-10-CM | POA: Diagnosis not present

## 2021-09-15 DIAGNOSIS — M25531 Pain in right wrist: Secondary | ICD-10-CM | POA: Diagnosis not present

## 2021-09-15 DIAGNOSIS — M62838 Other muscle spasm: Secondary | ICD-10-CM | POA: Insufficient documentation

## 2021-09-15 DIAGNOSIS — M85432 Solitary bone cyst, left ulna and radius: Secondary | ICD-10-CM | POA: Diagnosis not present

## 2021-09-15 DIAGNOSIS — M19032 Primary osteoarthritis, left wrist: Secondary | ICD-10-CM | POA: Diagnosis not present

## 2021-09-28 ENCOUNTER — Other Ambulatory Visit: Payer: Self-pay | Admitting: Internal Medicine

## 2021-09-29 ENCOUNTER — Encounter: Payer: Self-pay | Admitting: Family Medicine

## 2021-10-01 DIAGNOSIS — L729 Follicular cyst of the skin and subcutaneous tissue, unspecified: Secondary | ICD-10-CM | POA: Diagnosis not present

## 2021-10-07 ENCOUNTER — Ambulatory Visit: Payer: BC Managed Care – PPO | Admitting: Family Medicine

## 2021-10-07 ENCOUNTER — Encounter: Payer: Self-pay | Admitting: Family Medicine

## 2021-10-07 ENCOUNTER — Other Ambulatory Visit: Payer: Self-pay

## 2021-10-07 VITALS — BP 120/88 | HR 68 | Ht 67.0 in | Wt 197.0 lb

## 2021-10-07 DIAGNOSIS — M4722 Other spondylosis with radiculopathy, cervical region: Secondary | ICD-10-CM | POA: Insufficient documentation

## 2021-10-07 DIAGNOSIS — M778 Other enthesopathies, not elsewhere classified: Secondary | ICD-10-CM | POA: Insufficient documentation

## 2021-10-07 DIAGNOSIS — K219 Gastro-esophageal reflux disease without esophagitis: Secondary | ICD-10-CM | POA: Insufficient documentation

## 2021-10-07 DIAGNOSIS — M47812 Spondylosis without myelopathy or radiculopathy, cervical region: Secondary | ICD-10-CM | POA: Diagnosis not present

## 2021-10-07 MED ORDER — MELOXICAM 15 MG PO TABS
15.0000 mg | ORAL_TABLET | Freq: Every day | ORAL | 0 refills | Status: DC
Start: 2021-10-07 — End: 2021-12-08

## 2021-10-07 MED ORDER — PANTOPRAZOLE SODIUM 40 MG PO TBEC
40.0000 mg | DELAYED_RELEASE_TABLET | Freq: Every day | ORAL | 2 refills | Status: DC
Start: 2021-10-07 — End: 2021-12-08

## 2021-10-07 NOTE — Patient Instructions (Signed)
-   Continue meloxicam daily with food - Start pantoprazole daily on an empty stomach (wait 30 minutes before food) - Perform home exercises on a regular basis - Follow-up with rheumatology - Call for questions - Return in 2 months

## 2021-10-07 NOTE — Progress Notes (Signed)
Primary Care / Sports Medicine Office Visit  Patient Information:  Patient ID: Brandy Vasquez, female DOB: Dec 30, 1975 Age: 45 y.o. MRN: [id number]   Brandy Vasquez is a pleasant 45 y.o. female presenting with the following:  Chief Complaint  Patient presents with   Follow-up   Neck Pain    Better on meloxicam; X-Rays 09/15/21; denies pain in office   Hand Pain    Bilateral hands and wrists; X-Rays 09/15/21; right-handedness    Patient Active Problem List   Diagnosis Date Noted   Cervical spondylosis 10/07/2021   Gastroesophageal reflux disease without esophagitis 10/07/2021   Wrist tendonitis 10/07/2021   Furuncle 08/26/2021   Hemorrhagic cystitis 08/04/2021   Cervical paraspinal muscle spasm 08/04/2021   Metabolic syndrome 05/27/2021   Rotator cuff arthropathy of left shoulder 05/27/2021   Primary hypertension 05/15/2021   Acute pain of left shoulder 05/15/2021   Annual physical exam 04/03/2021   Fungal infection of skin 04/03/2021   Elevated blood pressure reading 04/03/2021    Vitals:   10/07/21 1141  BP: 120/88  Pulse: 68  SpO2: 98%   Vitals:   10/07/21 1141  Weight: 197 lb (89.4 kg)  Height: 5\' 7"  (1.702 m)   Body mass index is 30.85 kg/m.  DG Cervical Spine Complete  Result Date: 09/15/2021 CLINICAL DATA:  Cervical paraspinal muscle spasm. Chronic bilateral neck pain. Recent diagnosis of rheumatoid arthritis. Bilateral shoulder pain. EXAM: CERVICAL SPINE - COMPLETE 4+ VIEW COMPARISON:  None. FINDINGS: Cervical spine alignment is maintained. Vertebral body heights and intervertebral disc spaces are preserved. Trace early spurring at C5-C6 and C6-C7, disc spaces are preserved. No significant facet hypertrophy. The dens is intact. Posterior elements appear well-aligned. There is no evidence of fracture. Bony neural foramina are patent. No focal bone lesion. No prevertebral soft tissue edema. IMPRESSION: Trace early spondylosis at C5-C6 and C6-C7 with endplate  spurring. Otherwise negative cervical spine radiographs. Electronically Signed   By: 09/17/2021 M.D.   On: 09/15/2021 22:04   DG Wrist Complete Left  Result Date: 09/15/2021 CLINICAL DATA:  Chronic bilateral hand and wrist pain. Patient reports recent diagnosis of rheumatoid arthritis. EXAM: LEFT WRIST - COMPLETE 3+ VIEW COMPARISON:  None. FINDINGS: Bone mineralization is subjectively normal. Normal alignment. Normal joint spaces. There carpal bone cysts involving the distal scaphoid, no definite erosions. No periosteal reaction. No fracture. No soft tissue calcifications or focal soft tissue abnormality. IMPRESSION: Small carpal bone cysts involving the distal scaphoid, typically degenerative. No evidence of inflammatory arthropathy. Electronically Signed   By: 09/17/2021 M.D.   On: 09/15/2021 22:01   DG Wrist Complete Right  Result Date: 09/15/2021 CLINICAL DATA:  Chronic bilateral hand and wrist pain. Patient reports recent diagnosis of rheumatoid arthritis. EXAM: RIGHT WRIST - COMPLETE 3+ VIEW COMPARISON:  None. FINDINGS: Bone mineralization is subjectively normal. Normal alignment. Normal joint spaces. No erosion or periosteal reaction. No fracture. Normal carpal alignment. No soft tissue calcifications. There may be mild ulnar soft tissue edema. IMPRESSION: Possible mild ulnar soft tissue edema. Otherwise negative radiographs of the right wrist. No significant degenerative or evidence of inflammatory arthropathy. Electronically Signed   By: 09/17/2021 M.D.   On: 09/15/2021 22:00   DG Hand Complete Left  Result Date: 09/15/2021 CLINICAL DATA:  Chronic bilateral hand pain. Patient reports recent diagnosis of rheumatoid arthritis. EXAM: LEFT HAND - COMPLETE 3+ VIEW COMPARISON:  None. FINDINGS: Bone mineralization is subjectively normal. Normal alignment. Normal joint spaces. No osteophytes or erosions.  Carpal bone cyst in the scaphoid better assessed on concurrent wrist exam. No  periosteal reaction. No focal bone abnormality or fracture. No soft tissue calcification or soft tissue abnormality. IMPRESSION: No acute abnormality or evidence of inflammatory arthropathy. Electronically Signed   By: Narda Rutherford M.D.   On: 09/15/2021 22:02   DG Hand Complete Right  Result Date: 09/15/2021 CLINICAL DATA:  Chronic bilateral hand and wrist pain. Recent diagnosis of rheumatoid arthritis. EXAM: RIGHT HAND - COMPLETE 3+ VIEW COMPARISON:  None. FINDINGS: Bone mineralization is subjectively normal. Normal alignment. Normal joint spaces. No erosion or periostitis. No fracture. Normal appearance of the carpal bones. No fracture or focal bone abnormality. No soft tissue calcification or focal soft tissue abnormalities. IMPRESSION: Negative radiographs of the right hand. No significant degenerative or evidence of inflammatory arthropathy. Electronically Signed   By: Narda Rutherford M.D.   On: 09/15/2021 21:59     Independent interpretation of notes and tests performed by another provider:   Independent interpretation of x-rays of bilateral hands, wrist, cervical spine performed in patient's presence, images reviewed and questions answered.  Procedures performed:   None  Pertinent History, Exam, Impression, and Recommendations:   Gastroesophageal reflux disease without esophagitis Chronic condition with ongoing symptomatology in light of recent scheduled meloxicam.  Given her ongoing need for meloxicam I advised various treatment strategies and she will initiate pantoprazole as an adjunct.  Wrist tendonitis Reassuring x-rays noted, tendinopathy of primary concern, cannot exclude element of autoimmune conditions given recent serum studies.  Further evaluation by rheumatology planned, in interim she is to continue meloxicam and start a home-based rehab program.  Cervical spondylosis Recent x-rays show mild degenerative changes, focal in nature, this can account for paraspinal  ongoing spasm and symptomatology.  I have advised a home exercise program in addition to scheduled meloxicam.  She will maintain close follow-up with our group, once symptoms adequately controlled, plan to wean from medications.   Orders & Medications Meds ordered this encounter  Medications   meloxicam (MOBIC) 15 MG tablet    Sig: Take 1 tablet (15 mg total) by mouth daily.    Dispense:  60 tablet    Refill:  0   pantoprazole (PROTONIX) 40 MG tablet    Sig: Take 1 tablet (40 mg total) by mouth daily.    Dispense:  30 tablet    Refill:  2   No orders of the defined types were placed in this encounter.    Return in about 2 months (around 12/08/2021).     Jerrol Banana, MD   Primary Care Sports Medicine [facility]

## 2021-10-07 NOTE — Assessment & Plan Note (Signed)
Recent x-rays show mild degenerative changes, focal in nature, this can account for paraspinal ongoing spasm and symptomatology.  I have advised a home exercise program in addition to scheduled meloxicam.  She will maintain close follow-up with our group, once symptoms adequately controlled, plan to wean from medications.

## 2021-10-07 NOTE — Assessment & Plan Note (Signed)
Reassuring x-rays noted, tendinopathy of primary concern, cannot exclude element of autoimmune conditions given recent serum studies.  Further evaluation by rheumatology planned, in interim she is to continue meloxicam and start a home-based rehab program.

## 2021-10-07 NOTE — Assessment & Plan Note (Signed)
Chronic condition with ongoing symptomatology in light of recent scheduled meloxicam.  Given her ongoing need for meloxicam I advised various treatment strategies and she will initiate pantoprazole as an adjunct.

## 2021-10-28 NOTE — Progress Notes (Signed)
Office Visit Note  Patient: Brandy Vasquez             Date of Birth: [birth year]           MRN: [id number]             PCP: Montel Culver, MD Referring: Montel Culver, MD Visit Date: 10/29/2021 Occupation: Glass blower/designer  Subjective:  New Patient (Initial Visit) (Abnormal labs, bil shoulder pain, bil hip pain, bil wrist pain, bil hand pain)   History of Present Illness: Brandy Vasquez is a 46 y.o. female here for evaluation of joint pain in multiple sites bilateral hands and wrists and cervical spine pain. She has had some shoulder pain more longstanding, pain in the wrists and hands and hips is newer for about 6 months duration now. She uses her hands and shoulders a lot for work but there was no particular change in activity or events before symptoms started. Joints hurt and sometimes swell when she is working but less after 2 or more days off work. When present, these symptoms occur throughout the day and has morning stiffness about 1 hour.  Labs demonstrated positive RF and CCP antibodies and low positive ANA. Xrays with mild cervical spine degenerative changes but hands without much abnormality. She was started on meloxicam for symptoms and has noticed a good improvement when taking this medicine. Mild GI irritation not as bad now after starting pantoprazole and she takes the medicine with food.   She has additional concern about symptoms because her mother has rheumatoid arthritis on long term medication treatment and has had bilateral hip replacements.  Labs reviewed 08/2021 ANA 1:80 homogenous 1:40 centromere dsDNA, SM, RNP, SSA, SSB, Scl-70, TPO, chromatin Abs neg RF 61 CCP >250 Complement C3 C4 wnl HIV neg HCV neg Vit D 24.6   Activities of Daily Living:  Patient reports morning stiffness for 1 hour.   Patient Denies nocturnal pain.  Difficulty dressing/grooming: Denies Difficulty climbing stairs: Denies Difficulty getting out of chair: Denies Difficulty using  hands for taps, buttons, cutlery, and/or writing: Denies  Review of Systems  Constitutional:  Negative for fatigue.  HENT:  Negative for mouth dryness.   Eyes:  Negative for dryness.  Respiratory:  Negative for shortness of breath.   Cardiovascular:  Negative for swelling in legs/feet.  Gastrointestinal:  Negative for constipation.  Endocrine: Negative for excessive thirst.  Genitourinary:  Negative for difficulty urinating.  Musculoskeletal:  Positive for joint pain, joint pain, joint swelling and morning stiffness.  Skin:  Positive for rash.  Allergic/Immunologic: Negative for susceptible to infections.  Neurological:  Negative for numbness.  Hematological:  Negative for bruising/bleeding tendency.  Psychiatric/Behavioral:  Negative for sleep disturbance.    PMFS History:  Patient Active Problem List   Diagnosis Date Noted   Cyclic citrullinated peptide (CCP) antibody positive 10/29/2021   Cervical spondylosis 10/07/2021   Gastroesophageal reflux disease without esophagitis 10/07/2021   Wrist tendonitis 10/07/2021   Furuncle 08/26/2021   Hemorrhagic cystitis 08/04/2021   Cervical paraspinal muscle spasm 99991111   Metabolic syndrome [id number]   Rotator cuff arthropathy of left shoulder 05/27/2021   Primary hypertension 05/15/2021   Acute pain of left shoulder 05/15/2021   Annual physical exam 04/03/2021   Fungal infection of skin 04/03/2021   Elevated blood pressure reading 04/03/2021    Past Medical History:  Diagnosis Date   Herpes    Hypertension     Family History  Problem Relation Age of Onset   Hypertension  Mother    Diabetes Mother    Rheum arthritis Mother    Cancer Father    ADD / ADHD Daughter    ADD / ADHD Daughter    Alzheimer's disease Maternal Grandmother    Stroke Maternal Grandfather    Ovarian cancer Paternal Grandmother    Past Surgical History:  Procedure Laterality Date   BREAST BIOPSY Left 2016   Social History   Social History  Narrative   Not on file   Immunization History  Administered Date(s) Administered   Moderna Sars-Covid-2 Vaccination 06/27/2020   Tdap 08/26/2021     Objective: Vital Signs: BP (!) 143/96 (BP Location: Right Arm, Patient Position: Sitting, Cuff Size: Normal)    Pulse 79    Resp 15    Ht 5\' 7"  (1.702 m)    Wt 199 lb (90.3 kg)    BMI 31.17 kg/m    Physical Exam HENT:     Mouth/Throat:     Mouth: Mucous membranes are moist.     Pharynx: Oropharynx is clear.  Eyes:     Conjunctiva/sclera: Conjunctivae normal.  Cardiovascular:     Rate and Rhythm: Normal rate and regular rhythm.  Pulmonary:     Effort: Pulmonary effort is normal.     Breath sounds: Normal breath sounds.  Musculoskeletal:     Right lower leg: No edema.     Left lower leg: No edema.  Skin:    General: Skin is warm and dry.     Comments: Crescent shaped mildly erythematous rash on right anterior shin  Neurological:     Mental Status: She is alert.  Psychiatric:        Mood and Affect: Mood normal.     Musculoskeletal Exam:  Shoulders full ROM no tenderness or swelling Elbows full ROM no tenderness or swelling Wrists full ROM, mild tenderness to pressure on left wrist without palpable swelling, limited ultrasound inspection negative for obvious synovitis, tendonitis, or color doppler enhancement Fingers full ROM no tenderness or swelling No paraspinal tenderness to palpation over upper and lower back Lateral hip pain with internal rotation and FADIR, anterior hip pain with FABER maneuver Knees full ROM no tenderness or swelling Ankles full ROM no tenderness or swelling   CDAI Exam: CDAI Score: 2  Patient Global: 10 mm; Provider Global: 0 mm Swollen: 0 ; Tender: 1  Joint Exam 10/29/2021      Right  Left  Wrist      Tender     Investigation: No additional findings.  Imaging: No results found.  Recent Labs: Lab Results  Component Value Date   WBC 10.6 04/07/2021   HGB 13.9 04/07/2021   PLT  316 04/07/2021   NA 139 04/07/2021   K 3.5 04/07/2021   CL 105 04/07/2021   CO2 22 04/07/2021   GLUCOSE 86 04/07/2021   BUN 14 04/07/2021   CREATININE 0.90 04/07/2021   BILITOT 1.2 04/07/2021   ALKPHOS 88 02/01/2019   AST 15 04/07/2021   ALT 13 04/07/2021   PROT 7.6 04/07/2021   ALBUMIN 4.2 02/01/2019   CALCIUM 9.3 04/07/2021   GFRAA 91 04/07/2021    Speciality Comments: No specialty comments available.  Procedures:  No procedures performed Allergies: Bactrim [sulfamethoxazole-trimethoprim]   Assessment / Plan:     Visit Diagnoses: Cyclic citrullinated peptide (CCP) antibody positive - Plan: Sedimentation rate, C-reactive protein  Positive RF and CCP antibody titers with family history of RA and new reported joint swelling recorded in outside clinic.  Xrays are without any erosive change or localized demineralization. Exam today is nonspecific, but also is having a very low symptoms day off work for 4 days. Will check serum inflammatory markers and recommend f/u in a few weeks to recheck joints once back at work. Agree with continuing meloxicam for treatment at this time. If this does represent new RA, it is low activity and no erosive disease so no specific DMARD treatment needed at this time.  Wrist tendonitis Rotator cuff arthropathy of left shoulder  Previous concern for tendinopathy or tendonitis symptoms especially in left shoulder and wrist. No inflammatory changes on exam today although very mild wrist tenderness is present.  Orders: Orders Placed This Encounter  Procedures   Sedimentation rate   C-reactive protein   No orders of the defined types were placed in this encounter.   Follow-Up Instructions: Return for New pt ?RA on meloxicam f/u.   Collier Salina, MD  Note - This record has been created using [employer].  Chart creation errors have been sought, but may not always  have been located. Such creation errors do not reflect on  the standard of  medical care.

## 2021-10-29 ENCOUNTER — Ambulatory Visit: Payer: BC Managed Care – PPO | Admitting: Internal Medicine

## 2021-10-29 ENCOUNTER — Other Ambulatory Visit: Payer: Self-pay

## 2021-10-29 ENCOUNTER — Encounter: Payer: Self-pay | Admitting: Internal Medicine

## 2021-10-29 VITALS — BP 143/96 | HR 79 | Resp 15 | Ht 67.0 in | Wt 199.0 lb

## 2021-10-29 DIAGNOSIS — R768 Other specified abnormal immunological findings in serum: Secondary | ICD-10-CM

## 2021-10-29 DIAGNOSIS — M12812 Other specific arthropathies, not elsewhere classified, left shoulder: Secondary | ICD-10-CM | POA: Diagnosis not present

## 2021-10-29 DIAGNOSIS — M778 Other enthesopathies, not elsewhere classified: Secondary | ICD-10-CM | POA: Diagnosis not present

## 2021-10-30 LAB — C-REACTIVE PROTEIN: CRP: 6.4 mg/L (ref <8.0)

## 2021-10-30 LAB — SEDIMENTATION RATE: Sed Rate: 6 mm/h (ref 0–20)

## 2021-11-16 NOTE — Progress Notes (Signed)
Office Visit Note  Patient: Brandy Vasquez             Date of Birth: Jul 27, 1976           MRN: [id number]             PCP: Montel Culver, MD Referring: Montel Culver, MD Visit Date: 11/17/2021   Subjective:   History of Present Illness: Brandy Vasquez is a 46 y.o. female here for follow up for bilateral hand and wrist pain with positive RA antibody tests. At initial visit exam and inflammatory markers were normal but also symptoms were decreased due to being away from work for several days. She had a great vacation trip. She is back to work and has noticed some left shoulder pain and left wrist pain today.  Previous HPI 10/29/21 Brandy Vasquez is a 46 y.o. female here for evaluation of joint pain in multiple sites bilateral hands and wrists and cervical spine pain. She has had some shoulder pain more longstanding, pain in the wrists and hands and hips is newer for about 6 months duration now. She uses her hands and shoulders a lot for work but there was no particular change in activity or events before symptoms started. Joints hurt and sometimes swell when she is working but less after 2 or more days off work. When present, these symptoms occur throughout the day and has morning stiffness about 1 hour.   Labs demonstrated positive RF and CCP antibodies and low positive ANA. Xrays with mild cervical spine degenerative changes but hands without much abnormality. She was started on meloxicam for symptoms and has noticed a good improvement when taking this medicine. Mild GI irritation not as bad now after starting pantoprazole and she takes the medicine with food.    She has additional concern about symptoms because her mother has rheumatoid arthritis on long term medication treatment and has had bilateral hip replacements.   Labs reviewed 08/2021 ANA 1:80 homogenous 1:40 centromere dsDNA, SM, RNP, SSA, SSB, Scl-70, TPO, chromatin Abs neg RF 61 CCP >250   Review of Systems   Musculoskeletal:  Positive for joint pain, joint pain and muscle tenderness. Negative for joint swelling and muscle weakness.  Skin:  Negative for rash.  Neurological:  Negative for numbness.   PMFS History:  Patient Active Problem List   Diagnosis Date Noted   Cyclic citrullinated peptide (CCP) antibody positive 10/29/2021   Cervical spondylosis 10/07/2021   Gastroesophageal reflux disease without esophagitis 10/07/2021   Wrist tendonitis 10/07/2021   Furuncle 08/26/2021   Hemorrhagic cystitis 08/04/2021   Cervical paraspinal muscle spasm 44/31/5400   Metabolic syndrome 86/76/1950   Rotator cuff arthropathy of left shoulder 05/27/2021   Primary hypertension 05/15/2021   Acute pain of left shoulder 05/15/2021   Annual physical exam 04/03/2021   Fungal infection of skin 04/03/2021   Elevated blood pressure reading 04/03/2021    Past Medical History:  Diagnosis Date   Herpes    Hypertension     Family History  Problem Relation Age of Onset   Hypertension Mother    Diabetes Mother    Rheum arthritis Mother    Cancer Father    ADD / ADHD Daughter    ADD / ADHD Daughter    Alzheimer's disease Maternal Grandmother    Stroke Maternal Grandfather    Ovarian cancer Paternal Grandmother    Past Surgical History:  Procedure Laterality Date   BREAST BIOPSY Left 2016   Social History  Social History Narrative   Not on file   Immunization History  Administered Date(s) Administered   Moderna Sars-Covid-2 Vaccination 06/27/2020   Tdap 08/26/2021     Objective: Vital Signs: BP (!) 139/91 (BP Location: Left Arm, Patient Position: Sitting, Cuff Size: Small)    Pulse 93    Resp 12    Ht $R'5\' 6"'pq$  (1.676 m)    Wt 202 lb 12.8 oz (92 kg)    BMI 32.73 kg/m    Physical Exam Cardiovascular:     Rate and Rhythm: Normal rate and regular rhythm.  Pulmonary:     Effort: Pulmonary effort is normal.     Breath sounds: Normal breath sounds.  Skin:    General: Skin is warm and dry.      Findings: No rash.  Neurological:     Mental Status: She is alert.  Psychiatric:        Mood and Affect: Mood normal.     Musculoskeletal Exam:  Left shoulder pain with overhead abduction, pain with empty can test and cross atm abduction test Tenderness to palpation over left trapezius muscle and upper back Left wrist tenderness at ulnar side, no palpable swelling, ROM is normal Limited ultrasound exam of the left wrist without any visible synovitis or color doppler enhancement  CDAI Exam: CDAI Score: -- Patient Global: --; Provider Global: -- Swollen: 0 ; Tender: 2  Joint Exam 11/17/2021      Right  Left  Glenohumeral      Tender  Wrist      Tender     Investigation: No additional findings.  Imaging: No results found.  Recent Labs: Lab Results  Component Value Date   WBC 10.6 04/07/2021   HGB 13.9 04/07/2021   PLT 316 04/07/2021   NA 139 04/07/2021   K 3.5 04/07/2021   CL 105 04/07/2021   CO2 22 04/07/2021   GLUCOSE 86 04/07/2021   BUN 14 04/07/2021   CREATININE 0.90 04/07/2021   BILITOT 1.2 04/07/2021   ALKPHOS 88 02/01/2019   AST 15 04/07/2021   ALT 13 04/07/2021   PROT 7.6 04/07/2021   ALBUMIN 4.2 02/01/2019   CALCIUM 9.3 04/07/2021   GFRAA 91 04/07/2021    Speciality Comments: No specialty comments available.  Procedures:  No procedures performed Allergies: Bactrim [sulfamethoxazole-trimethoprim]   Assessment / Plan:     Visit Diagnoses: Wrist tendonitis  Currently mild pain no deficits and no inflammatory changes on exam. This could be early inflammatory disease but would recommend conservative treatment continuing the as needed NSAIDs. If symptoms progress over time or more problems with swelling, erythema, warmth changes would definitely consider starting more long term treatment. Will scheduled f/u for later this year or PRN.  Rotator cuff arthropathy of left shoulder  Similar as before, currently involvement of trapezius and upper back  muscles maybe more than supraspinatus based on this exam.  Cyclic citrullinated peptide (CCP) antibody positive    Orders: No orders of the defined types were placed in this encounter.  No orders of the defined types were placed in this encounter.    Follow-Up Instructions: Return in about 8 months (around 07/18/2022), or if symptoms worsen or fail to improve.   Collier Salina, MD  Note - This record has been created using [employer].  Chart creation errors have been sought, but may not always  have been located. Such creation errors do not reflect on  the standard of medical care.

## 2021-11-17 ENCOUNTER — Ambulatory Visit (INDEPENDENT_AMBULATORY_CARE_PROVIDER_SITE_OTHER): Payer: BC Managed Care – PPO | Admitting: Internal Medicine

## 2021-11-17 ENCOUNTER — Encounter: Payer: Self-pay | Admitting: Family Medicine

## 2021-11-17 ENCOUNTER — Encounter: Payer: Self-pay | Admitting: Internal Medicine

## 2021-11-17 ENCOUNTER — Other Ambulatory Visit: Payer: Self-pay

## 2021-11-17 VITALS — BP 139/91 | HR 93 | Resp 12 | Ht 66.0 in | Wt 202.8 lb

## 2021-11-17 DIAGNOSIS — M778 Other enthesopathies, not elsewhere classified: Secondary | ICD-10-CM

## 2021-11-17 DIAGNOSIS — M12812 Other specific arthropathies, not elsewhere classified, left shoulder: Secondary | ICD-10-CM

## 2021-11-17 DIAGNOSIS — R768 Other specified abnormal immunological findings in serum: Secondary | ICD-10-CM | POA: Diagnosis not present

## 2021-11-27 DIAGNOSIS — Z975 Presence of (intrauterine) contraceptive device: Secondary | ICD-10-CM | POA: Diagnosis not present

## 2021-11-27 DIAGNOSIS — Z113 Encounter for screening for infections with a predominantly sexual mode of transmission: Secondary | ICD-10-CM | POA: Diagnosis not present

## 2021-11-27 DIAGNOSIS — Z1151 Encounter for screening for human papillomavirus (HPV): Secondary | ICD-10-CM | POA: Diagnosis not present

## 2021-11-27 DIAGNOSIS — Z01419 Encounter for gynecological examination (general) (routine) without abnormal findings: Secondary | ICD-10-CM | POA: Diagnosis not present

## 2021-12-08 ENCOUNTER — Other Ambulatory Visit: Payer: Self-pay

## 2021-12-08 ENCOUNTER — Other Ambulatory Visit: Payer: Self-pay | Admitting: Family Medicine

## 2021-12-08 ENCOUNTER — Encounter: Payer: Self-pay | Admitting: Family Medicine

## 2021-12-08 ENCOUNTER — Ambulatory Visit (INDEPENDENT_AMBULATORY_CARE_PROVIDER_SITE_OTHER): Payer: BC Managed Care – PPO | Admitting: Family Medicine

## 2021-12-08 VITALS — BP 104/72 | HR 93 | Ht 66.0 in | Wt 201.0 lb

## 2021-12-08 DIAGNOSIS — R768 Other specified abnormal immunological findings in serum: Secondary | ICD-10-CM

## 2021-12-08 DIAGNOSIS — B369 Superficial mycosis, unspecified: Secondary | ICD-10-CM

## 2021-12-08 DIAGNOSIS — K219 Gastro-esophageal reflux disease without esophagitis: Secondary | ICD-10-CM | POA: Diagnosis not present

## 2021-12-08 DIAGNOSIS — M4722 Other spondylosis with radiculopathy, cervical region: Secondary | ICD-10-CM | POA: Diagnosis not present

## 2021-12-08 DIAGNOSIS — B354 Tinea corporis: Secondary | ICD-10-CM

## 2021-12-08 DIAGNOSIS — S6982XA Other specified injuries of left wrist, hand and finger(s), initial encounter: Secondary | ICD-10-CM

## 2021-12-08 MED ORDER — CLOTRIMAZOLE-BETAMETHASONE 1-0.05 % EX CREA: 1.0000 "application " | TOPICAL_CREAM | Freq: Two times a day (BID) | CUTANEOUS | 0 refills | Status: DC

## 2021-12-08 MED ORDER — PANTOPRAZOLE SODIUM 40 MG PO TBEC: 40.0000 mg | DELAYED_RELEASE_TABLET | Freq: Every day | ORAL | 0 refills | Status: DC

## 2021-12-08 MED ORDER — DICLOFENAC SODIUM 75 MG PO TBEC: 75.0000 mg | DELAYED_RELEASE_TABLET | Freq: Two times a day (BID) | ORAL | 1 refills | Status: DC | PRN

## 2021-12-08 NOTE — Assessment & Plan Note (Signed)
Patient presents for follow-up to cervical spondylosis, at her last visit on 10/07/2021, she was persistently symptomatic and a home-based rehab program in addition to scheduled meloxicam was advised.  Since that time she has noted progressively worsening symptoms with episodes of right upper extremity paresthesias, denies any trauma or change in activity otherwise.  Given her worsened symptoms, I have advised formal physical therapy, a transition to as needed diclofenac, and close follow-up in 6 weeks with low threshold for advanced imaging of symptoms persist without adequate improvement.  Chronic condition, worsening, Rx management

## 2021-12-08 NOTE — Assessment & Plan Note (Addendum)
Patient is interested in a second opinion given her recent labs, a new rheumatology referral was placed today at patient's request.  We will follow peripherally on this issue.

## 2021-12-08 NOTE — Assessment & Plan Note (Signed)
Noted over the past several weeks, involves left lower leg anteriorly, circular pattern raise concern for tinea, Rx Lotrisone prescribed.

## 2021-12-08 NOTE — Assessment & Plan Note (Signed)
Patient presents for follow-up to chronic GERD, despite pantoprazole she has noted persistent symptoms, though without worsening.  As such, I have advised her to continue pantoprazole, we will modify and submitted from scheduled meloxicam to as needed diclofenac in effort to mitigate symptoms.  For persistent symptomatology, referral to GI to be considered.   Condition, symptomatic, Rx management

## 2021-12-08 NOTE — Progress Notes (Signed)
Primary Care / Sports Medicine Office Visit  Patient Information:  Patient ID: Brandy Vasquez, female DOB: December 05, 1975 Age: 46 y.o. MRN: [id number]   Brandy Vasquez is a pleasant 46 y.o. female presenting with the following:  Chief Complaint  Patient presents with   Follow-up   Shoulder Pain   Gastroesophageal Reflux    Stable on pantoprazole   Wrist Pain   Neck Pain    Vitals:   12/08/21 1051  BP: 104/72  Pulse: 93  SpO2: 97%   Vitals:   12/08/21 1051  Weight: 201 lb (91.2 kg)  Height: 5\' 6"  (1.676 m)   Body mass index is 32.44 kg/m.  No results found.   Independent interpretation of notes and tests performed by another provider:   None  Procedures performed:   None  Pertinent History, Exam, Impression, and Recommendations:   Gastroesophageal reflux disease without esophagitis Patient presents for follow-up to chronic GERD, despite pantoprazole she has noted persistent symptoms, though without worsening.  As such, I have advised her to continue pantoprazole, we will modify and submitted from scheduled meloxicam to as needed diclofenac in effort to mitigate symptoms.  For persistent symptomatology, referral to GI to be considered.   Condition, symptomatic, Rx management  Cyclic citrullinated peptide (CCP) antibody positive Patient is interested in a second opinion given her recent labs, a new rheumatology referral was placed today at patient's request.  We will follow peripherally on this issue.  Cervical spondylosis with radiculopathy Patient presents for follow-up to cervical spondylosis, at her last visit on 10/07/2021, she was persistently symptomatic and a home-based rehab program in addition to scheduled meloxicam was advised.  Since that time she has noted progressively worsening symptoms with episodes of right upper extremity paresthesias, denies any trauma or change in activity otherwise.  Given her worsened symptoms, I have advised formal physical  therapy, a transition to as needed diclofenac, and close follow-up in 6 weeks with low threshold for advanced imaging of symptoms persist without adequate improvement.  Chronic condition, worsening, Rx management  Fungal infection of skin Noted over the past several weeks, involves left lower leg anteriorly, circular pattern raise concern for tinea, Rx Lotrisone prescribed.  Injury of triangular fibrocartilage complex of left wrist Previously noted wrist tendinitis appears to have resolved, she has residual and focal tenderness at the ulnar aspect of the wrist, positive grind test, provocative testing otherwise benign.  Given her clinical course, concern for underlying TFCC injury with secondary/compensatory tendinopathy, the latter of which which appears to have resolved.  At this stage she will start formal physical therapy, transition from meloxicam to as needed diclofenac, and maintain close follow-up in 6 weeks.  For suboptimal progress, ultrasound-guided cortisone injection to be considered.   Orders & Medications Meds ordered this encounter  Medications   diclofenac (VOLTAREN) 75 MG EC tablet    Sig: Take 1 tablet (75 mg total) by mouth 2 (two) times daily as needed.    Dispense:  30 tablet    Refill:  1   pantoprazole (PROTONIX) 40 MG tablet    Sig: Take 1 tablet (40 mg total) by mouth daily.    Dispense:  90 tablet    Refill:  0   clotrimazole-betamethasone (LOTRISONE) cream    Sig: Apply 1 application topically 2 (two) times daily. X 1 week, can continue 1 additional week if symptoms persist, do not exceed 2 weeks of usage    Dispense:  45 g    Refill:  0   Orders Placed This Encounter  Procedures   Ambulatory referral to Rheumatology   Ambulatory referral to Physical Therapy     Return in about 6 weeks (around 01/19/2022).     Montel Culver, MD   Primary Care Sports [facility] [place]

## 2021-12-08 NOTE — Assessment & Plan Note (Signed)
Previously noted wrist tendinitis appears to have resolved, she has residual and focal tenderness at the ulnar aspect of the wrist, positive grind test, provocative testing otherwise benign.  Given her clinical course, concern for underlying TFCC injury with secondary/compensatory tendinopathy, the latter of which which appears to have resolved.  At this stage she will start formal physical therapy, transition from meloxicam to as needed diclofenac, and maintain close follow-up in 6 weeks.  For suboptimal progress, ultrasound-guided cortisone injection to be considered.

## 2021-12-09 ENCOUNTER — Other Ambulatory Visit: Payer: Self-pay | Admitting: Family Medicine

## 2021-12-09 ENCOUNTER — Other Ambulatory Visit: Payer: Self-pay

## 2021-12-09 DIAGNOSIS — B354 Tinea corporis: Secondary | ICD-10-CM

## 2021-12-09 DIAGNOSIS — B369 Superficial mycosis, unspecified: Secondary | ICD-10-CM

## 2021-12-09 MED ORDER — MICONAZOLE NITRATE 2 % EX CREA: 1.0000 "application " | TOPICAL_CREAM | Freq: Two times a day (BID) | CUTANEOUS | 0 refills | Status: DC

## 2021-12-09 NOTE — Telephone Encounter (Signed)
Please Rx 2% miconazole topical for patient to apply BID x 2 weeks, dispense quantity sufficient or 1 tube. No refills.

## 2021-12-09 NOTE — Telephone Encounter (Signed)
Requested medications are due for refill today.  yes  Requested medications are on the active medications list.    Last refill.   Future visit scheduled.   yes  Notes to clinic.  Pharmacy is requesting a substitution as this medication is not covered by pt's insurance.    Requested Prescriptions  Pending Prescriptions Disp Refills   clotrimazole-betamethasone (LOTRISONE) lotion [Pharmacy Med Name: CLOTRIMAZOLE-BETAMETHASONE LOT]  0     Off-Protocol Failed - 12/08/2021 12:34 PM      Failed - Medication not assigned to a protocol, review manually.      Passed - Valid encounter within last 12 months    Recent Outpatient Visits           Yesterday Cervical spondylosis with radiculopathy   [facility] Jerrol Banana, MD   2 months ago Cervical spondylosis   [facility] Jerrol Banana, MD   3 months ago Annual physical exam   [facility] Jerrol Banana, MD   4 months ago Hemorrhagic cystitis   [facility] Jerrol Banana, MD       Future Appointments             In 1 month Ashley Royalty, Ocie Bob, MD [facility]   In 8 months Rice, Jamesetta Orleans, MD [facility]

## 2021-12-09 NOTE — Telephone Encounter (Signed)
Prescription sent to the pharmacy electronically.  °

## 2021-12-09 NOTE — Telephone Encounter (Signed)
Please advise for alternative.  Online the [employer] formulary states it will cover Miconazole.

## 2021-12-09 NOTE — Telephone Encounter (Signed)
Requested medications are due for refill today.  NA  Requested medications are on the active medications list.  yes  Last refill. 12/08/2021  Future visit scheduled.   yes  Notes to clinic.  Pharmacy is requesting an alternate medication as this one is not covered by pt's insurance.    Requested Prescriptions  Pending Prescriptions Disp Refills   clotrimazole-betamethasone (LOTRISONE) lotion [Pharmacy Med Name: CLOTRIMAZOLE-BETAMETHASONE LOT]  0     Off-Protocol Failed - 12/09/2021 11:32 AM      Failed - Medication not assigned to a protocol, review manually.      Passed - Valid encounter within last 12 months    Recent Outpatient Visits           Yesterday Cervical spondylosis with radiculopathy   [facility] Jerrol Banana, MD   2 months ago Cervical spondylosis   [facility] Jerrol Banana, MD   3 months ago Annual physical exam   [facility] Jerrol Banana, MD   4 months ago Hemorrhagic cystitis   [facility] Jerrol Banana, MD       Future Appointments             In 1 month Ashley Royalty, Ocie Bob, MD [facility]   In 8 months Rice, Jamesetta Orleans, MD [facility]

## 2021-12-16 ENCOUNTER — Ambulatory Visit: Payer: BC Managed Care – PPO | Admitting: Physical Therapy

## 2021-12-17 ENCOUNTER — Other Ambulatory Visit: Payer: Self-pay

## 2021-12-17 ENCOUNTER — Ambulatory Visit: Payer: BC Managed Care – PPO | Attending: Family Medicine

## 2021-12-17 DIAGNOSIS — S6982XA Other specified injuries of left wrist, hand and finger(s), initial encounter: Secondary | ICD-10-CM | POA: Insufficient documentation

## 2021-12-17 DIAGNOSIS — M62838 Other muscle spasm: Secondary | ICD-10-CM | POA: Insufficient documentation

## 2021-12-17 DIAGNOSIS — M4722 Other spondylosis with radiculopathy, cervical region: Secondary | ICD-10-CM | POA: Diagnosis not present

## 2021-12-17 DIAGNOSIS — M542 Cervicalgia: Secondary | ICD-10-CM | POA: Diagnosis not present

## 2021-12-17 DIAGNOSIS — R6889 Other general symptoms and signs: Secondary | ICD-10-CM | POA: Diagnosis not present

## 2021-12-17 NOTE — Therapy (Signed)
[facility] [address] [place], [place], [address] Phone: [phone]   Fax:  [phone]  Physical Therapy Evaluation  Patient Details  Name: Brandy MillerShawna Vasquez MRN: [id number] Date of Birth: 05/27/1976 Referring Provider (PT): Joseph BerkshireJason Matthews, MD   Encounter Date: 12/17/2021   PT End of Session - 12/17/21 1533     Visit Number 1    Number of Visits 12    Date for PT Re-Evaluation 01/28/22    Authorization Type BCBS    Authorization Time Period 12/17/21-01/28/22    Progress Note Due on Visit 10    PT Start Time 1421    PT Stop Time 1500    PT Time Calculation (min) 39 min    Activity Tolerance Patient tolerated treatment well;No increased pain    Behavior During Therapy WFL for tasks assessed/performed             Past Medical History:  Diagnosis Date   GERD (gastroesophageal reflux disease)    Herpes    Hypertension     Past Surgical History:  Procedure Laterality Date   BREAST BIOPSY Left 2016    There were no vitals filed for this visit.    Subjective Assessment - 12/17/21 1514     Subjective Artist PaisShawna Marina Vasquez is a 46yoF [facility] from Joseph BerkshireJason Matthews MD for chronic insidious bilat pain in the coat hanger distributions, across both upper traps areas.    Pertinent History Brandy MillerShawna Vasquez is a 46yoF who reports 6 month history of insidious onset bilat pain in shoulders accross bilat upper trap. Pt reports bilat temporal HA last 4-6 week as well, unclear if related. Pt works a very physical job x5 years with clear exacerbation of symptoms on work days and throughout her 12 hours shifts. Pt denies any new weakness, clumsiness, or paresthesias in BUE. Pt denies any assocaited restriction in head movements during her ADL/IADL. Xrays taken were unremarkable.    How long can you sit comfortably? unrelated    How long can you stand comfortably? unrelated    How long can you walk comfortably?  unrelated    Diagnostic tests xray    Patient Stated Goals reduce pain to better tolerate work schedule    Currently in Pain? Yes    Pain Score 3     Pain Location --   bilat upper traps area across shoulders               [facility] - 12/17/21 0001       Assessment   Medical Diagnosis Bilat lower cervicalgia with bilat temporal HA    Referring Provider (PT) Joseph BerkshireJason Matthews, MD    Onset Date/Surgical Date 05/25/22    Hand Dominance Right      Precautions   Precautions None      Balance Screen   Has the patient fallen in the past 6 months No    Has the patient had a decrease in activity level because of a fear of falling?  No    Is the patient reluctant to leave their home because of a fear of falling?  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, standing, pouring heavy bags     Observation/Other Assessments   Focus on Therapeutic Outcomes (FOTO)  71  Sensation   Light Touch Appears Intact   pt denies any gross paresthesia     ROM / Strength   AROM / PROM / Strength AROM;Strength      AROM   AROM Assessment Site Cervical    Cervical Flexion --   wnl   Cervical Extension 33   pain free   Cervical - Right Side Bend 33   UT stretch without pain   Cervical - Left Side Bend 33   UT stretch without pain   Cervical - Right Rotation 65   P/ROM overpressure to 78 degrees pain free   Cervical - Left Rotation 62   P/ROM overpressure to ~70 degrees firm end-feel     Strength   Strength Assessment Site Shoulder;Elbow    Right/Left Shoulder Left;Right    Right Shoulder Flexion 4+/5    Right Shoulder ABduction 5/5    Right Shoulder Internal Rotation 5/5    Right Shoulder External Rotation 5/5    Left Shoulder Flexion 4+/5    Left Shoulder ABduction 5/5    Left Shoulder Internal Rotation 5/5    Left Shoulder External Rotation 5/5   feels mcuh  stronger than dominant CL side   Right/Left Elbow Right;Left    Right Elbow Flexion 5/5    Right Elbow Extension 5/5    Left Elbow Flexion 5/5    Left Elbow Extension 5/5      Flexibility   Soft Tissue Assessment /Muscle Length yes      Palpation   Palpation comment reproductoin of symptoms with deep palpation of middle belly upper trap posterior;   reduced tissue tension and tenderness after ~40sec sustained ischaemic release              Objective measurements completed on examination: See above findings.       [facility] Adult PT Treatment/Exercise - 12/17/21 0001       Exercises   Exercises Shoulder      Shoulder Exercises: Standing   Protraction AROM;Both;Other (comment)   60sec isometric hold stretch (prayer stretch for middle/upper traps)     Manual Therapy   Manual Therapy Myofascial release    Myofascial Release bilat upper traps x3 minute bilat                     PT Education - 12/17/21 1532     Education Details results of exam and plan for treatment    Person(s) Educated Patient    Methods Explanation    Comprehension Verbalized understanding              PT Short Term Goals - 12/17/21 1547       PT SHORT TERM GOAL #1   Title Pt to report decreased maximal intensity of pain by 2 NPRS or more.    Time 2    Period Weeks    Status New    Target Date 12/31/21      PT SHORT TERM GOAL #2   Title Pt to report adherence, compliance, and utility of HEP established for pain management and shoulder strengthening.    Time 3    Period Weeks    Status New    Target Date 01/07/22      PT SHORT TERM GOAL #3   Title Pt to demonstrate >4 point improvement on FOTO survey to indicated improved perception of ease in basic mobliity for ADL/IADL performance.    Time 3    Period Weeks    Status  New    Target Date 01/07/22               PT Long Term Goals - 12/17/21 1648       PT LONG TERM GOAL #1   Title Pt to reports ability to make  it through a work shift without exacerbation of pain in bilat shoulders >2/10.    Time 6    Period Weeks    Status New    Target Date 01/28/22      PT LONG TERM GOAL #2   Title Pt able to demonstrate proper form and knowledge of advanced HEP for DC to promote continued strength progression in shoulders.    Time 6    Period Weeks    Status New    Target Date 01/28/22                    Plan - 12/17/21 1534     Clinical Impression Statement Examination revealing of excellent Bilat overhead mobility and general strength in bilat shoulders/elbows. Based on description of symptoms and aggravating factors, examination results, and physical demands of job, sounds as though pt may be experiencing symptoms secondary to bilat upper traps fatigue/strain in the setting of repeated physicla demands at work. Anticipate quick and excellent reduction in symptoms and pain with manual therapies to reduce taut bands and myofascial trigger points and srength/conditioning of shoulders/UT to improve tissue resiliency and endurance for prolonged job needs.    Personal Factors and Comorbidities Age;Fitness;Time since onset of injury/illness/exacerbation    Examination-Activity Limitations Reach Overhead;Lift;Carry    Examination-Participation Restrictions Occupation    Stability/Clinical Decision Making Stable/Uncomplicated    Clinical Decision Making Low    Rehab Potential Excellent    PT Frequency 2x / week    PT Duration 6 weeks    PT Treatment/Interventions Electrical Stimulation;Moist Heat;Functional mobility training;Therapeutic activities;Therapeutic exercise;Patient/family education;Dry needling;Passive range of motion    PT Next Visit Plan discuss some dry needling to bilat UT, further HA assessment as needed, review middle trap stretch (give handout), issue HEP    PT Home Exercise Plan visit 2 issue shoulder ABDCT, Shoulder elevation, and row with scapular retraction.    Consulted and Agree  with Plan of Care Patient             Patient will benefit from skilled therapeutic intervention in order to improve the following deficits and impairments:  Decreased activity tolerance, Decreased mobility, Decreased strength, Difficulty walking, Increased muscle spasms, Increased fascial restricitons, Impaired UE functional use  Visit Diagnosis: Cervicalgia  Other muscle spasm  Decreased activity tolerance     Problem List Patient Active Problem List   Diagnosis Date Noted   Injury of triangular fibrocartilage complex of left wrist 12/08/2021   Cyclic citrullinated peptide (CCP) antibody positive 10/29/2021   Cervical spondylosis with radiculopathy 10/07/2021   Gastroesophageal reflux disease without esophagitis 10/07/2021   Furuncle 08/26/2021   Hemorrhagic cystitis 08/04/2021   Cervical paraspinal muscle spasm 08/04/2021   Metabolic syndrome 05/27/2021   Rotator cuff arthropathy of left shoulder 05/27/2021   Primary hypertension 05/15/2021   Acute pain of left shoulder 05/15/2021   Annual physical exam 04/03/2021   Fungal infection of skin 04/03/2021   Elevated blood pressure reading 04/03/2021   Dysplasia of cervix, low grade (CIN 1) 08/31/2018   4:53 PM, 12/17/21 Rosamaria Lints, PT, DPT Physical Therapist - [place] [phone] (Office)   [place] C, PT 12/17/2021, 4:53 PM  [place] [facility] [address] [place], [address] Phone: [phone]   Fax:  [phone]  Name: Aaliyana Fredericks MRN: [id number] Date of Birth: 02/25/1976

## 2021-12-21 ENCOUNTER — Ambulatory Visit: Payer: BC Managed Care – PPO | Admitting: Physical Therapy

## 2021-12-21 ENCOUNTER — Other Ambulatory Visit: Payer: Self-pay

## 2021-12-21 DIAGNOSIS — M62838 Other muscle spasm: Secondary | ICD-10-CM

## 2021-12-21 DIAGNOSIS — R6889 Other general symptoms and signs: Secondary | ICD-10-CM

## 2021-12-21 DIAGNOSIS — M542 Cervicalgia: Secondary | ICD-10-CM | POA: Diagnosis not present

## 2021-12-21 DIAGNOSIS — M4722 Other spondylosis with radiculopathy, cervical region: Secondary | ICD-10-CM | POA: Diagnosis not present

## 2021-12-21 DIAGNOSIS — S6982XA Other specified injuries of left wrist, hand and finger(s), initial encounter: Secondary | ICD-10-CM | POA: Diagnosis not present

## 2021-12-21 NOTE — Therapy (Signed)
[place] [facility] [address], [place], [address] Phone: [phone]   Fax:  [phone]  Physical Therapy Treatment  Patient Details  Name: Brandy Vasquez MRN: [id number] Date of Birth: January 05, 1976 Referring Provider (PT): Joseph Berkshire, MD   Encounter Date: 12/21/2021    Past Medical History:  Diagnosis Date   GERD (gastroesophageal reflux disease)    Herpes    Hypertension     Past Surgical History:  Procedure Laterality Date   BREAST BIOPSY Left 2016    There were no vitals filed for this visit.     INTERVENTION THIS DATE:  ThereEx:  Standing resisted rows, GTB, 2x15 Standing resisted shoulder extension, GTB, 2x15 Standing resisted shoulder monster walks, GTB, 2x10 Verbal cuing required for relaxation of UT's when performing resisted rows.   Manual Therapy:  Supine STM to cervical region to increase extensibility of the paraspinals Supine suboccipital release technique to decrease cervicalgia Supine manual traction performed in order to increase joint space in cervical region for pain relief Supine cervical upglides/downglides, 30 sec bouts Supine UT/Levator stretch, 30 sec bouts to increase tissue extensibility of the cervical region       PT Short Term Goals - 12/17/21 1547       PT SHORT TERM GOAL #1   Title Pt to report decreased maximal intensity of pain by 2 NPRS or more.    Time 2    Period Weeks    Status New    Target Date 12/31/21      PT SHORT TERM GOAL #2   Title Pt to report adherence, compliance, and utility of HEP established for pain management and shoulder strengthening.    Time 3    Period Weeks    Status New    Target Date 01/07/22      PT SHORT TERM GOAL #3   Title Pt to demonstrate >4 point improvement on FOTO survey to indicated improved perception of ease in basic mobliity for ADL/IADL performance.    Time 3    Period Weeks    Status New    Target  Date 01/07/22               PT Long Term Goals - 12/17/21 1648       PT LONG TERM GOAL #1   Title Pt to reports ability to make it through a work shift without exacerbation of pain in bilat shoulders >2/10.    Time 6    Period Weeks    Status New    Target Date 01/28/22      PT LONG TERM GOAL #2   Title Pt able to demonstrate proper form and knowledge of advanced HEP for DC to promote continued strength progression in shoulders.    Time 6    Period Weeks    Status New    Target Date 01/28/22                    Patient will benefit from skilled therapeutic intervention in order to improve the following deficits and impairments:     Visit Diagnosis: No diagnosis found.     Problem List Patient Active Problem List   Diagnosis Date Noted   Injury of triangular fibrocartilage complex of left wrist 12/08/2021   Cyclic citrullinated peptide (CCP) antibody positive 10/29/2021   Cervical spondylosis with radiculopathy 10/07/2021   Gastroesophageal reflux disease without esophagitis 10/07/2021   Furuncle 08/26/2021   Hemorrhagic cystitis 08/04/2021  Cervical paraspinal muscle spasm 08/04/2021   Metabolic syndrome 05/27/2021   Rotator cuff arthropathy of left shoulder 05/27/2021   Primary hypertension 05/15/2021   Acute pain of left shoulder 05/15/2021   Annual physical exam 04/03/2021   Fungal infection of skin 04/03/2021   Elevated blood pressure reading 04/03/2021   Dysplasia of cervix, low grade (CIN 1) 08/31/2018    Nolon Bussing, PT, DPT 12/21/21, 7:02 PM   [place] [facility] [address], [place], [address] Phone: [phone]   Fax:  [phone]  Name: Brandy Vasquez MRN: [id number] Date of Birth: June 01, 1976

## 2021-12-23 ENCOUNTER — Ambulatory Visit: Payer: BC Managed Care – PPO | Attending: Family Medicine

## 2021-12-23 ENCOUNTER — Encounter: Payer: BC Managed Care – PPO | Admitting: Physical Therapy

## 2021-12-23 ENCOUNTER — Other Ambulatory Visit: Payer: Self-pay

## 2021-12-23 DIAGNOSIS — R6889 Other general symptoms and signs: Secondary | ICD-10-CM | POA: Diagnosis not present

## 2021-12-23 DIAGNOSIS — M542 Cervicalgia: Secondary | ICD-10-CM | POA: Insufficient documentation

## 2021-12-23 DIAGNOSIS — M62838 Other muscle spasm: Secondary | ICD-10-CM | POA: Diagnosis not present

## 2021-12-23 NOTE — Therapy (Signed)
[place] ?[facility] ?[address] ?[place], [place], [address] ?Phone: [phone]   Fax:  [phone] ? ?Physical Therapy Treatment ? ?Patient Details  ?Name: Brandy Vasquez ?MRN: [id number] ?Date of Birth: 09-04-1976 ?Referring Provider (PT): Joseph Berkshire, MD ? ? ?Encounter Date: 12/23/2021 ? ? PT End of Session - 12/23/21 0955   ? ? Visit Number 3   ? Number of Visits 12   ? Date for PT Re-Evaluation 01/28/22   ? Authorization Type BCBS   ? Authorization Time Period 12/17/21-01/28/22   ? Progress Note Due on Visit 10   ? PT Start Time 0930   ? PT Stop Time 1010   ? PT Time Calculation (min) 40 min   ? Activity Tolerance Patient tolerated treatment well;No increased pain   ? Behavior During Therapy [facility] for tasks assessed/performed   ? ?  ?  ? ?  ? ? ?Past Medical History:  ?Diagnosis Date  ? GERD (gastroesophageal reflux disease)   ? Herpes   ? Hypertension   ? ? ?Past Surgical History:  ?Procedure Laterality Date  ? BREAST BIOPSY Left 2016  ? ? ?There were no vitals filed for this visit. ? ? Subjective Assessment - 12/23/21 0938   ? ? Subjective Pt doing well today. Has been working on her HEP at home with success.   ? Pertinent History Brandy Vasquez is a 46yoF who reports 6 month history of insidious onset bilat pain in shoulders accross bilat upper trap. Pt reports bilat temporal HA last 4-6 week as well, unclear if related. Pt works a very physical job x5 years with clear exacerbation of symptoms on work days and throughout her 12 hours shifts. Pt denies any new weakness, clumsiness, or paresthesias in BUE. Pt denies any assocaited restriction in head movements during her ADL/IADL. Xrays taken were unremarkable.   ? Currently in Pain? Yes   ? Pain Score 2    across both shoulders  ? ?  ?  ? ?  ? ? ? ?INTERVENTION THIS DATE: ?-TPDN b ilat upper trapxz .30x86mm 2 sticks each side, left much more responsive and twtichy ?-bilat UT manual release and ART with arm  movement c concurrent heat application to CL UT ? ?-Bilat shoulder ABDCT to 90 c 5lb x12 ?-Bilat shoulder flexion to 90 c 5lb x12 ?-bilat shoulder elevation 1x10 c 5lb FW ?-standing cable row 25lb x12  ?*cues for hands near axillae at end with end range scap retraction and thoracic extension ?*cues to DC scapular elevation with row.  ? ?-Bilat shoulder ABDCT to 90 c 5lb x12 ?-Bilat shoulder flexion to 90 c 5lb x12 ?-bilat shoulder elevation 1x10 c 5lb FW ?-standing cable row 25lb x12  ? ?-Big blue ball seated fowrward rollouts 10x5secH ? ? ? ? ? ? ? ? PT Education - 12/23/21 0955   ? ? Education Details dry needling and shoulder strengthening   ? Person(s) Educated Patient   ? Methods Explanation   ? Comprehension Verbalized understanding   ? ?  ?  ? ?  ? ? ? PT Short Term Goals - 12/17/21 1547   ? ?  ? PT SHORT TERM GOAL #1  ? Title Pt to report decreased maximal intensity of pain by 2 NPRS or more.   ? Time 2   ? Period Weeks   ? Status New   ? Target Date 12/31/21   ?  ? PT SHORT TERM GOAL #2  ?  Title Pt to report adherence, compliance, and utility of HEP established for pain management and shoulder strengthening.   ? Time 3   ? Period Weeks   ? Status New   ? Target Date 01/07/22   ?  ? PT SHORT TERM GOAL #3  ? Title Pt to demonstrate >4 point improvement on FOTO survey to indicated improved perception of ease in basic mobliity for ADL/IADL performance.   ? Time 3   ? Period Weeks   ? Status New   ? Target Date 01/07/22   ? ?  ?  ? ?  ? ? ? ? PT Long Term Goals - 12/17/21 1648   ? ?  ? PT LONG TERM GOAL #1  ? Title Pt to reports ability to make it through a work shift without exacerbation of pain in bilat shoulders >2/10.   ? Time 6   ? Period Weeks   ? Status New   ? Target Date 01/28/22   ?  ? PT LONG TERM GOAL #2  ? Title Pt able to demonstrate proper form and knowledge of advanced HEP for DC to promote continued strength progression in shoulders.   ? Time 6   ? Period Weeks   ? Status New   ? Target Date  01/28/22   ? ?  ?  ? ?  ? ? ? ? ? ? ? ? Plan - 12/23/21 0957   ? ? Clinical Impression Statement Started needling this date to UT bilat, pt gives consent, educated on potential side effects and typical course following. Started shoulder and UT conditioning this date with good tolerance. Asked to continue with stretch at home and shoulder monster walk.   ? Personal Factors and Comorbidities Age;Fitness;Time since onset of injury/illness/exacerbation   ? Examination-Activity Limitations Reach Overhead;Lift;Carry   ? Examination-Participation Restrictions Occupation   ? Stability/Clinical Decision Making Stable/Uncomplicated   ? Clinical Decision Making Low   ? Rehab Potential Excellent   ? PT Frequency 2x / week   ? PT Duration 6 weeks   ? PT Treatment/Interventions Electrical Stimulation;Moist Heat;Functional mobility training;Therapeutic activities;Therapeutic exercise;Patient/family education;Dry needling;Passive range of motion   ? PT Next Visit Plan Continue with manual therapy and shoulder strength   ? PT Home Exercise Plan mid trap stretch, GTB wall walk monster band   ? Consulted and Agree with Plan of Care Patient   ? ?  ?  ? ?  ? ? ?Patient will benefit from skilled therapeutic intervention in order to improve the following deficits and impairments:  Decreased activity tolerance, Decreased mobility, Decreased strength, Difficulty walking, Increased muscle spasms, Increased fascial restricitons, Impaired UE functional use ? ?Visit Diagnosis: ?Cervicalgia ? ?Other muscle spasm ? ?Decreased activity tolerance ? ? ? ? ?Problem List ?Patient Active Problem List  ? Diagnosis Date Noted  ? Injury of triangular fibrocartilage complex of left wrist 12/08/2021  ? Cyclic citrullinated peptide (CCP) antibody positive 10/29/2021  ? Cervical spondylosis with radiculopathy 10/07/2021  ? Gastroesophageal reflux disease without esophagitis 10/07/2021  ? Furuncle 08/26/2021  ? Hemorrhagic cystitis 08/04/2021  ? Cervical  paraspinal muscle spasm 08/04/2021  ? Metabolic syndrome 05/27/2021  ? Rotator cuff arthropathy of left shoulder 05/27/2021  ? Primary hypertension 05/15/2021  ? Acute pain of left shoulder 05/15/2021  ? Annual physical exam 04/03/2021  ? Fungal infection of skin 04/03/2021  ? Elevated blood pressure reading 04/03/2021  ? Dysplasia of cervix, low grade (CIN 1) 08/31/2018  ? ?10:14 AM, 12/23/21 ?Berlinda Last  Que Meneely, PT, DPT ?Physical Therapist [facility] ?[phone] (Office) ? ? ?Yandel Zeiner C, PT ?12/23/2021, 10:02 AM ? ?[place] ?[facility] ?[address] ?[place], [place], [address] ?Phone: [phone]   Fax:  [phone] ? ?Name: Brandy Vasquez ?MRN: [id number] ?Date of Birth: 11/08/1975 ? ? ? ?

## 2021-12-23 NOTE — Patient Instructions (Signed)

## 2021-12-24 DIAGNOSIS — Z7960 Long term (current) use of unspecified immunomodulators and immunosuppressants: Secondary | ICD-10-CM | POA: Insufficient documentation

## 2021-12-24 DIAGNOSIS — Z111 Encounter for screening for respiratory tuberculosis: Secondary | ICD-10-CM | POA: Diagnosis not present

## 2021-12-24 DIAGNOSIS — M0579 Rheumatoid arthritis with rheumatoid factor of multiple sites without organ or systems involvement: Secondary | ICD-10-CM | POA: Diagnosis not present

## 2021-12-29 ENCOUNTER — Other Ambulatory Visit: Payer: Self-pay

## 2021-12-29 ENCOUNTER — Ambulatory Visit: Payer: BC Managed Care – PPO | Admitting: Physical Therapy

## 2021-12-29 ENCOUNTER — Encounter: Payer: Self-pay | Admitting: Physical Therapy

## 2021-12-29 DIAGNOSIS — R6889 Other general symptoms and signs: Secondary | ICD-10-CM | POA: Diagnosis not present

## 2021-12-29 DIAGNOSIS — M542 Cervicalgia: Secondary | ICD-10-CM

## 2021-12-29 DIAGNOSIS — M62838 Other muscle spasm: Secondary | ICD-10-CM | POA: Diagnosis not present

## 2021-12-29 NOTE — Therapy (Signed)
[place] ?[facility] ?[address] ?[place], [place], [address] ?Phone: [phone]   Fax:  [phone] ? ?Physical Therapy Treatment ? ?Patient Details  ?Name: Brandy Vasquez ?MRN: [id number] ?Date of Birth: [birth year] ?Referring Provider (PT): Joseph Berkshire, MD ? ? ?Encounter Date: 12/29/2021 ? ? PT End of Session - 12/29/21 5035   ? ? Visit Number 4   ? Number of Visits 12   ? Date for PT Re-Evaluation 01/28/22   ? Authorization Type BCBS   ? Authorization Time Period 12/17/21-01/28/22   ? Progress Note Due on Visit 10   ? PT Start Time 0930   ? PT Stop Time 1015   ? PT Time Calculation (min) 45 min   ? Activity Tolerance Patient tolerated treatment well;No increased pain   ? Behavior During Therapy [facility] for tasks assessed/performed   ? ?  ?  ? ?  ? ? ?Past Medical History:  ?Diagnosis Date  ? GERD (gastroesophageal reflux disease)   ? Herpes   ? Hypertension   ? ? ?Past Surgical History:  ?Procedure Laterality Date  ? BREAST BIOPSY Left 2016  ? ? ?There were no vitals filed for this visit. ? ? Subjective Assessment - 12/29/21 0936   ? ? Subjective Pt reports seeing rheumatologist recently who reports that she is positive for rheumatoid arthritis. She has recently started prednisone who has helped significantly reduce her pain in her shoulder and wrists.   ? Pertinent History Brandy Vasquez is a 46yoF who reports 6 month history of insidious onset bilat pain in shoulders accross bilat upper trap. Pt reports bilat temporal HA last 4-6 week as well, unclear if related. Pt works a very physical job x5 years with clear exacerbation of symptoms on work days and throughout her 12 hours shifts. Pt denies any new weakness, clumsiness, or paresthesias in BUE. Pt denies any assocaited restriction in head movements during her ADL/IADL. Xrays taken were unremarkable.   ? Currently in Pain? No/denies   ? Pain Score 0-No pain   ? ?  ?  ? ?  ? ?THEREX:  ? ?UBE Level 3 Mets Seat at 10  -5 min forward and 5 min back  ? ?Mid Trap Stretch in Sitting 3 x 30 sec  ? ?Banded Shoulder T's using BTB 2 x 10  ? ?MANUAL THERAPY:   ? ?Education on use of theracane for trigger point release  ? ? trigger point release and massage of left medial parascapular   ? ? ? ? ? ? PT Education - 12/29/21 0937   ? ? Education Details form and technique for appropriate exercise   ? Person(s) Educated Patient   ? Methods Explanation;Demonstration;Verbal cues;Handout   ? Comprehension Verbalized understanding;Verbal cues required;Returned demonstration   ? ?  ?  ? ?  ? ? ? PT Short Term Goals - 12/29/21 0939   ? ?  ? PT SHORT TERM GOAL #1  ? Title Pt to report decreased maximal intensity of pain by 2 NPRS or more.   ? Time 2   ? Period Weeks   ? Status On-going   ? Target Date 12/31/21   ?  ? PT SHORT TERM GOAL #2  ? Title Pt to report adherence, compliance, and utility of HEP established for pain management and shoulder strengthening.   ? Time 3   ? Period Weeks   ? Status On-going   ? Target Date 01/07/22   ?  ? PT  SHORT TERM GOAL #3  ? Title Pt to demonstrate >4 point improvement on FOTO survey to indicated improved perception of ease in basic mobliity for ADL/IADL performance.   ? Time 3   ? Period Weeks   ? Status On-going   ? Target Date 01/07/22   ? ?  ?  ? ?  ? ? ? ? PT Long Term Goals - 12/29/21 0939   ? ?  ? PT LONG TERM GOAL #1  ? Title Pt to reports ability to make it through a work shift without exacerbation of pain in bilat shoulders >2/10.   ? Time 6   ? Period Weeks   ? Status On-going   ? Target Date 01/28/22   ?  ? PT LONG TERM GOAL #2  ? Title Pt able to demonstrate proper form and knowledge of advanced HEP for DC to promote continued strength progression in shoulders.   ? Time 6   ? Period Weeks   ? Status On-going   ? Target Date 01/28/22   ? ?  ?  ? ?  ? ? ? ? ? ? ? ? Plan - 12/29/21 0938   ? ? Clinical Impression Statement Pt shows reduced pain of medial border of shoulder which is likely due to use of  prednisone. She shows increased trigger points on medial border of left scapula that resolved after trigger point release. Pt demonstrates understanding self trigger point release using theracane. Unclear about whether MSK left parascapular pain is result of overuse from job or from rheumatoid arthritis. 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   ? Personal Factors and Comorbidities Age;Fitness;Time since onset of injury/illness/exacerbation   ? Examination-Activity Limitations Reach Overhead;Lift;Carry   ? Examination-Participation Restrictions Occupation   ? Stability/Clinical Decision Making Stable/Uncomplicated   ? Rehab Potential Excellent   ? PT Frequency 2x / week   ? PT Duration 6 weeks   ? PT Treatment/Interventions Electrical Stimulation;Moist Heat;Functional mobility training;Therapeutic activities;Therapeutic exercise;Patient/family education;Dry needling;Passive range of motion   ? PT Next Visit Plan Increase parascapular strength and continue with soft tissue trigger point release   ? PT Home Exercise Plan [email or web address]   ? Consulted and Agree with Plan of Care Patient   ? ?  ?  ? ?  ? ?HEP includes the following: ? ?Access Code: [email or web address] ?URL: [email or web address] ?Date: 12/29/2021 ?Prepared by: Ellin Goodie ? ?Exercises ?Standing Shoulder Horizontal Abduction with Resistance - 1 x daily - 3 x weekly - 2 sets - 10 reps ?Seated Mid Trap Stretch 3 x 30 sec x 7  ? ? ?Patient will benefit from skilled therapeutic intervention in order to improve the following deficits and impairments:  Decreased activity tolerance, Decreased mobility, Decreased strength, Difficulty walking, Increased muscle spasms, Increased fascial restricitons, Impaired UE functional use ? ?Visit Diagnosis: ?Cervicalgia ? ?Other muscle spasm ? ?Decreased activity  tolerance ? ? ? ? ?Problem List ?Patient Active Problem List  ? Diagnosis Date Noted  ? Injury of triangular fibrocartilage complex of left wrist 12/08/2021  ? Cyclic citrullinated peptide (CCP) antibody positive 10/29/2021  ? Cervical spondylosis with radiculopathy 10/07/2021  ? Gastroesophageal reflux disease without esophagitis 10/07/2021  ? Furuncle 08/26/2021  ? Hemorrhagic cystitis 08/04/2021  ? Cervical paraspinal muscle spasm 08/04/2021  ? Metabolic syndrome 05/27/2021  ? Rotator cuff arthropathy of left shoulder 05/27/2021  ?  Primary hypertension 05/15/2021  ? Acute pain of left shoulder 05/15/2021  ? Annual physical exam 04/03/2021  ? Fungal infection of skin 04/03/2021  ? Elevated blood pressure reading 04/03/2021  ? Dysplasia of cervix, low grade (CIN 1) 08/31/2018  ? ?Ellin Goodie PT, DPT  ?12/29/2021, 12:40 PM ? ?[place] ?[facility] ?[address] ?[place], [place], [address] ?Phone: [phone]   Fax:  [phone] ? ?Name: Brandy Vasquez ?MRN: [id number] ?Date of Birth: January 21, 1976 ? ? ? ?

## 2021-12-31 ENCOUNTER — Ambulatory Visit: Payer: BC Managed Care – PPO | Admitting: Physical Therapy

## 2021-12-31 ENCOUNTER — Telehealth: Payer: Self-pay | Admitting: Physical Therapy

## 2021-12-31 NOTE — Telephone Encounter (Signed)
Attempted to call pt to inquiry about absence but did not reach so left VM instructing pt to call back to reschedule.  ?

## 2022-01-04 ENCOUNTER — Other Ambulatory Visit: Payer: Self-pay

## 2022-01-04 ENCOUNTER — Ambulatory Visit: Payer: BC Managed Care – PPO | Admitting: Physical Therapy

## 2022-01-04 DIAGNOSIS — R6889 Other general symptoms and signs: Secondary | ICD-10-CM

## 2022-01-04 DIAGNOSIS — M542 Cervicalgia: Secondary | ICD-10-CM

## 2022-01-04 DIAGNOSIS — M62838 Other muscle spasm: Secondary | ICD-10-CM

## 2022-01-04 NOTE — Therapy (Signed)
[place] ?[facility] ?[address] ?[place], [place], [address] ?Phone: [phone]   Fax:  [phone] ? ?Physical Therapy Treatment ? ?Patient Details  ?Name: Brandy Vasquez ?MRN: [id number] ?Date of Birth: 10-21-1976 ?Referring Provider (PT): Joseph Berkshire, MD ? ? ?Encounter Date: 01/04/2022 ? ? PT End of Session - 01/04/22 0846   ? ? Visit Number 5   ? Number of Visits 12   ? Date for PT Re-Evaluation 01/28/22   ? Authorization Type BCBS   ? Authorization Time Period 12/17/21-01/28/22   ? Progress Note Due on Visit 10   ? PT Start Time 0805   ? PT Stop Time 0845   ? PT Time Calculation (min) 40 min   ? Activity Tolerance Patient tolerated treatment well;No increased pain   ? Behavior During Therapy [facility] for tasks assessed/performed   ? ?  ?  ? ?  ? ? ?Past Medical History:  ?Diagnosis Date  ? GERD (gastroesophageal reflux disease)   ? Herpes   ? Hypertension   ? ? ?Past Surgical History:  ?Procedure Laterality Date  ? BREAST BIOPSY Left 2016  ? ? ?There were no vitals filed for this visit. ? ? Subjective Assessment - 01/04/22 0808   ? ? Subjective Pt reports that her left shoulder is feeling worse today because she recently got off a work shift where they were lifting and reaching frequently.   ? Pertinent History Tynika Luddy is a 46yoF who reports 6 month history of insidious onset bilat pain in shoulders accross bilat upper trap. Pt reports bilat temporal HA last 4-6 week as well, unclear if related. Pt works a very physical job x5 years with clear exacerbation of symptoms on work days and throughout her 12 hours shifts. Pt denies any new weakness, clumsiness, or paresthesias in BUE. Pt denies any assocaited restriction in head movements during her ADL/IADL. Xrays taken were unremarkable.   ? Pain Score 3    ? Pain Location Scapula   ? Pain Orientation Left   ? Pain Descriptors / Indicators Aching   ? ?  ?  ? ?  ? ?MANUAL THERAPY: ? ?STM of left scapulae upper,  mid, and lower musculature  ?Upper trap trigger point release  ? ? ?THEREX: ? ?UBE Level 2 - 5 min  ? ?Upper Trap Stretch 3 x 30 sec  ? ?D2 Flexion PNF  3 x 10 with YTB  ? ?Push Up with Plus 3 x 10  ? ?  ? ? ? ? ? ? ? ? ? ? ? ? ? ? ? ? ? ? ? ? ? ? ? ? ? ? PT Education - 01/04/22 0810   ? ? Education Details form and technique for appropriate exercise   ? Person(s) Educated Patient   ? Methods Explanation   ? Comprehension Verbalized understanding;Returned demonstration;Tactile cues required   ? ?  ?  ? ?  ? ? ? PT Short Term Goals - 01/04/22 0848   ? ?  ? PT SHORT TERM GOAL #1  ? Title Pt to report decreased maximal intensity of pain by 2 NPRS or more.   ? Time 2   ? Period Weeks   ? Status On-going   ? Target Date 12/31/21   ?  ? PT SHORT TERM GOAL #2  ? Title Pt to report adherence, compliance, and utility of HEP established for pain management and shoulder strengthening.   ? Time 3   ?  Period Weeks   ? Status On-going   ? Target Date 01/07/22   ?  ? PT SHORT TERM GOAL #3  ? Title Pt to demonstrate >4 point improvement on FOTO survey to indicated improved perception of ease in basic mobliity for ADL/IADL performance.   ? Time 3   ? Period Weeks   ? Status On-going   ? Target Date 01/07/22   ? ?  ?  ? ?  ? ? ? ? PT Long Term Goals - 01/04/22 0849   ? ?  ? PT LONG TERM GOAL #1  ? Title Pt to reports ability to make it through a work shift without exacerbation of pain in bilat shoulders >2/10.   ? Time 6   ? Period Weeks   ? Status On-going   ? Target Date 01/28/22   ?  ? PT LONG TERM GOAL #2  ? Title Pt able to demonstrate proper form and knowledge of advanced HEP for DC to promote continued strength progression in shoulders.   ? Time 6   ? Period Weeks   ? Status On-going   ? Target Date 01/28/22   ? ?  ?  ? ?  ? ? ? ? ? ? ? ? Plan - 01/04/22 0847   ? ? Clinical Impression Statement Pt continues to demonstrates an improvement in parascapular strength with ability to perform D2 flexion and wall push ups. Trigger  points reduced in left scapulae. PT recommends that pt do exercises on off day to avoid overactivity with her job which requires significant use of UE.   ? Personal Factors and Comorbidities Age;Fitness;Time since onset of injury/illness/exacerbation   ? Examination-Activity Limitations Reach Overhead;Lift;Carry   ? Examination-Participation Restrictions Occupation   ? Stability/Clinical Decision Making Stable/Uncomplicated   ? Rehab Potential Excellent   ? PT Frequency 2x / week   ? PT Duration 6 weeks   ? PT Treatment/Interventions Electrical Stimulation;Moist Heat;Functional mobility training;Therapeutic activities;Therapeutic exercise;Patient/family education;Dry needling;Passive range of motion   ? PT Next Visit Plan Increase parascapular strength and continue with soft tissue trigger point release   ? PT Home Exercise Plan [email or web address]   ? Consulted and Agree with Plan of Care Patient   ? ?  ?  ? ?  ? ?HEP includes the following:  ? ?Access Code: [email or web address] ?URL: [email or web address] ?Date: 01/04/2022 ?Prepared by: Ellin Goodie ? ?Exercises ?Seated Upper Trapezius Stretch - 1 x daily - 7 x weekly - 1 sets - 3 reps - 30 hold ?Standing Shoulder Horizontal Abduction with Resistance - 1 x daily - 3 x weekly - 3 sets - 10 reps ?Shoulder PNF D2 Flexion - 1 x daily - 3 x weekly - 3 sets - 10 reps ?Wall Push Up with Plus - 1 x daily - 3 x weekly - 3 sets - 10 reps ? ?Patient will benefit from skilled therapeutic intervention in order to improve the following deficits and impairments:  Decreased activity tolerance, Decreased mobility, Decreased strength, Difficulty walking, Increased muscle spasms, Increased fascial restricitons, Impaired UE functional use ? ?Visit Diagnosis: ?Cervicalgia ? ?Other muscle spasm ? ?Decreased activity tolerance ? ? ? ? ?Problem List ?Patient Active Problem List  ? Diagnosis Date Noted  ? Injury of triangular fibrocartilage complex of left wrist 12/08/2021  ? Cyclic  citrullinated peptide (CCP) antibody positive 10/29/2021  ? Cervical spondylosis with radiculopathy 10/07/2021  ? Gastroesophageal reflux disease without esophagitis 10/07/2021  ? Furuncle 08/26/2021  ? Hemorrhagic cystitis 08/04/2021  ? Cervical  paraspinal muscle spasm 08/04/2021  ? Metabolic syndrome 05/27/2021  ? Rotator cuff arthropathy of left shoulder 05/27/2021  ? Primary hypertension 05/15/2021  ? Acute pain of left shoulder 05/15/2021  ? Annual physical exam 04/03/2021  ? Fungal infection of skin 04/03/2021  ? Elevated blood pressure reading 04/03/2021  ? Dysplasia of cervix, low grade (CIN 1) 08/31/2018  ? ?Ellin Goodieaniel Lolly Glaus PT, DPT  ?01/04/2022, 8:50 AM ? ?[place] ?[facility] ?[address] ?[place], [place], [address] ?Phone: [phone]   Fax:  [phone] ? ?Name: Carmina MillerShawna Holroyd ?MRN: [id number] ?Date of Birth: 04/06/1976 ? ? ? ?

## 2022-01-06 ENCOUNTER — Other Ambulatory Visit: Payer: Self-pay

## 2022-01-06 ENCOUNTER — Ambulatory Visit: Payer: BC Managed Care – PPO | Admitting: Physical Therapy

## 2022-01-06 DIAGNOSIS — M542 Cervicalgia: Secondary | ICD-10-CM

## 2022-01-06 DIAGNOSIS — M62838 Other muscle spasm: Secondary | ICD-10-CM | POA: Diagnosis not present

## 2022-01-06 DIAGNOSIS — R6889 Other general symptoms and signs: Secondary | ICD-10-CM

## 2022-01-06 NOTE — Therapy (Signed)
[place] ?[facility] ?[address] ?[place], [place], [address] ?Phone: [phone]   Fax:  [phone] ? ?Physical Therapy Treatment ? ?Patient Details  ?Name: Brandy Vasquez ?MRN: [id number] ?Date of Birth: 1976-05-08 ?Referring Provider (PT): Rosette Reveal, MD ? ? ?Encounter Date: 01/06/2022 ? ? PT End of Session - 01/06/22 0939   ? ? Visit Number 6   ? Number of Visits 12   ? Date for PT Re-Evaluation 01/28/22   ? Authorization Type BCBS   ? Authorization Time Period 12/17/21-01/28/22   ? Progress Note Due on Visit 10   ? PT Start Time [id number]   ? PT Stop Time 1015   ? PT Time Calculation (min) 40 min   ? Activity Tolerance Patient tolerated treatment well;No increased pain   ? Behavior During Therapy [facility] for tasks assessed/performed   ? ?  ?  ? ?  ? ? ?Past Medical History:  ?Diagnosis Date  ? GERD (gastroesophageal reflux disease)   ? Herpes   ? Hypertension   ? ? ?Past Surgical History:  ?Procedure Laterality Date  ? BREAST BIOPSY Left 2016  ? ? ?There were no vitals filed for this visit. ? ? Subjective Assessment - 01/06/22 0936   ? ? Subjective Pt states that her left shoulder is feeling better since starting PT. She did not work last which is what attributes her shoulder feeling better.   ? Pertinent History Brandy Vasquez is a 21yoF who reports 6 month history of insidious onset bilat pain in shoulders accross bilat upper trap. Pt reports bilat temporal HA last 4-6 week as well, unclear if related. Pt works a very physical job x5 years with clear exacerbation of symptoms on work days and throughout her 12 hours shifts. Pt denies any new weakness, clumsiness, or paresthesias in BUE. Pt denies any assocaited restriction in head movements during her ADL/IADL. Xrays taken were unremarkable.   ? How long can you sit comfortably? unrelated   ? How long can you stand comfortably? unrelated   ? How long can you walk comfortably? unrelated   ? Currently in Pain? No/denies    ? Pain Score 0-No pain   ? ?  ?  ? ?  ? ? ?THEREX:  ? ?UBE Resistance Level 3 for 5 min  ? ?OMEGA Shoulder Rows #35  3 x 10  ?[place] #35 3 x 10 ? ?Prone Shoulder Y's 2 x 10  ?Prone Shoulder T's 2 x 10  ? ?Reviewed cervical imaging results with patient and discussed results.  ? ? ? ? ? ? ? ? ? ? ? ? ? ? ? ? ? ? ? ? ? PT Education - 01/06/22 0938   ? ? Education Details form and technique for appropriate exercise   ? Person(s) Educated Patient   ? Methods Explanation;Demonstration;Verbal cues;Handout   ? Comprehension Verbalized understanding;Returned demonstration;Verbal cues required   ? ?  ?  ? ?  ? ? ? PT Short Term Goals - 01/06/22 0940   ? ?  ? PT SHORT TERM GOAL #1  ? Title Pt to report decreased maximal intensity of pain by 2 NPRS or more.   ? Time 2   ? Period Weeks   ? Status On-going   ? Target Date 12/31/21   ?  ? PT SHORT TERM GOAL #2  ? Title Pt to report adherence, compliance, and utility of HEP established for pain management and shoulder strengthening.   ?  Time 3   ? Period Weeks   ? Status On-going   ? Target Date 01/07/22   ?  ? PT SHORT TERM GOAL #3  ? Title Pt to demonstrate >4 point improvement on FOTO survey to indicated improved perception of ease in basic mobliity for ADL/IADL performance.   ? Time 3   ? Period Weeks   ? Status On-going   ? Target Date 01/07/22   ? ?  ?  ? ?  ? ? ? ? PT Long Term Goals - 01/06/22 0940   ? ?  ? PT LONG TERM GOAL #1  ? Title Pt to reports ability to make it through a work shift without exacerbation of pain in bilat shoulders >2/10.   ? Time 6   ? Period Weeks   ? Status On-going   ? Target Date 01/28/22   ?  ? PT LONG TERM GOAL #2  ? Title Pt able to demonstrate proper form and knowledge of advanced HEP for DC to promote continued strength progression in shoulders.   ? Time 6   ? Period Weeks   ? Status On-going   ? Target Date 01/28/22   ? ?  ?  ? ?  ? ? ? ? ? ? ? ? Plan - 01/06/22 0939   ? ? Clinical Impression Statement Pt exhibits progress  with parascapular strengthening with ability to perform prone t's and y's without discomfort. Pt educated on use of gym equipment for shoulder strengthening for eventual transition to independent exercise at [employer]. She will continue to benefit from skilled PT to decrease shoulder pain in order to complete job related tasks without pain or discomfort.   ? Personal Factors and Comorbidities Age;Fitness;Time since onset of injury/illness/exacerbation   ? Examination-Activity Limitations Reach Overhead;Lift;Carry   ? Examination-Participation Restrictions Occupation   ? Stability/Clinical Decision Making Stable/Uncomplicated   ? Rehab Potential Excellent   ? PT Frequency 2x / week   ? PT Duration 6 weeks   ? PT Treatment/Interventions Electrical Stimulation;Moist Heat;Functional mobility training;Therapeutic activities;Therapeutic exercise;Patient/family education;Dry needling;Passive range of motion   ? PT Next Visit Plan Total gym. Face Pulls on Omega. Progress parascapular exercises to shoulder raises and scapular raises with DB   ? PT Home Exercise Plan [email or web address]   ? Consulted and Agree with Plan of Care Patient   ? ?  ?  ? ?  ? ?HEP includes the following: ? ?Access Code: [email or web address] ?URL: [email or web address] ?Date: 01/06/2022 ?Prepared by: Ellin Goodie ? ?Exercises ?Seated Upper Trapezius Stretch - 1 x daily - 7 x weekly - 1 sets - 3 reps - 30 hold ?Wall Push Up with Plus - 1 x daily - 3 x weekly - 3 sets - 10 reps ?Prone Single Arm Shoulder Y with Dumbbell - 1 x daily - 3 x weekly - 3 sets - 10 reps ?Prone Shoulder Horizontal Abduction - 1 x daily - 3 x weekly - 2 sets - 10 reps ? ? ?Patient will benefit from skilled therapeutic intervention in order to improve the following deficits and impairments:  Decreased activity tolerance, Decreased mobility, Decreased strength, Difficulty walking, Increased muscle spasms, Increased fascial restricitons, Impaired UE functional use ? ?Visit  Diagnosis: ?Cervicalgia ? ?Other muscle spasm ? ?Decreased activity tolerance ? ? ? ? ?Problem List ?Patient Active Problem List  ? Diagnosis Date Noted  ? Injury of triangular fibrocartilage complex of left wrist 12/08/2021  ? Cyclic citrullinated peptide (CCP) antibody positive 10/29/2021  ? Cervical  spondylosis with radiculopathy 10/07/2021  ? Gastroesophageal reflux disease without esophagitis 10/07/2021  ? Furuncle 08/26/2021  ? Hemorrhagic cystitis 08/04/2021  ? Cervical paraspinal muscle spasm 08/04/2021  ? Metabolic syndrome [id number]  ? Rotator cuff arthropathy of left shoulder 05/27/2021  ? Primary hypertension 05/15/2021  ? Acute pain of left shoulder 05/15/2021  ? Annual physical exam 04/03/2021  ? Fungal infection of skin 04/03/2021  ? Elevated blood pressure reading 04/03/2021  ? Dysplasia of cervix, low grade (CIN 1) 08/31/2018  ? ?Bradly Chris PT, DPT  ?01/06/2022, 10:19 AM ? ?[place] ?[place] [facility] ?[address] ?[place], [place], [address] ?Phone: [phone]   Fax:  [phone] ? ?Name: Brandy Vasquez ?MRN: [id number] ?Date of Birth: [birth year] ? ? ? ?

## 2022-01-14 ENCOUNTER — Other Ambulatory Visit: Payer: Self-pay

## 2022-01-14 ENCOUNTER — Ambulatory Visit: Payer: BC Managed Care – PPO | Admitting: Physical Therapy

## 2022-01-14 DIAGNOSIS — R6889 Other general symptoms and signs: Secondary | ICD-10-CM | POA: Diagnosis not present

## 2022-01-14 DIAGNOSIS — M542 Cervicalgia: Secondary | ICD-10-CM | POA: Diagnosis not present

## 2022-01-14 DIAGNOSIS — M62838 Other muscle spasm: Secondary | ICD-10-CM

## 2022-01-14 NOTE — Therapy (Signed)
?OUTPATIENT PHYSICAL THERAPY TREATMENT NOTE ? ? ?Patient Name: Brandy Vasquez ?MRN: [id number] ?DOB:December 10, 1975, 46 y.o., female ?Today's Date: 01/14/2022 ? ?PCP: Montel Culver, MD ?REFERRING PROVIDER: Montel Culver, MD ? ? PT End of Session - 01/14/22 0819   ? ? Visit Number 7   ? Number of Visits 12   ? Date for PT Re-Evaluation 01/28/22   ? Authorization Type BCBS   ? Authorization Time Period 12/17/21-01/28/22   ? Progress Note Due on Visit 10   ? PT Start Time 0805   ? PT Stop Time 0845   ? PT Time Calculation (min) 40 min   ? Activity Tolerance Patient tolerated treatment well;No increased pain   ? Behavior During Therapy [facility] for tasks assessed/performed   ? ?  ?  ? ?  ? ? ?Past Medical History:  ?Diagnosis Date  ? GERD (gastroesophageal reflux disease)   ? Herpes   ? Hypertension   ? ?Past Surgical History:  ?Procedure Laterality Date  ? BREAST BIOPSY Left 2016  ? ?Patient Active Problem List  ? Diagnosis Date Noted  ? Injury of triangular fibrocartilage complex of left wrist 12/08/2021  ? Cyclic citrullinated peptide (CCP) antibody positive 10/29/2021  ? Cervical spondylosis with radiculopathy 10/07/2021  ? Gastroesophageal reflux disease without esophagitis 10/07/2021  ? Furuncle 08/26/2021  ? Hemorrhagic cystitis 08/04/2021  ? Cervical paraspinal muscle spasm 08/04/2021  ? Metabolic syndrome 82/70/7867  ? Rotator cuff arthropathy of left shoulder 05/27/2021  ? Primary hypertension 05/15/2021  ? Acute pain of left shoulder 05/15/2021  ? Annual physical exam 04/03/2021  ? Fungal infection of skin 04/03/2021  ? Elevated blood pressure reading 04/03/2021  ? Dysplasia of cervix, low grade (CIN 1) 08/31/2018  ? ? ?REFERRING DIAG: Cervicalgia  ? ?THERAPY DIAG:  ?Cervicalgia ? ?Other muscle spasm ? ?PERTINENT HISTORY: Brandy Vasquez is a 74yoF who reports 6 month history of insidious onset bilat pain in shoulders accross bilat upper trap. Pt reports bilat temporal HA last 4-6 week as well, unclear if related. Pt  works a very physical job x5 years with clear exacerbation of symptoms on work days and throughout her 12 hours shifts. Pt denies any new weakness, clumsiness, or paresthesias in BUE. Pt denies any assocaited restriction in head movements during her ADL/IADL. Xrays taken were unremarkable.  ? ?PRECAUTIONS: None  ? ?SUBJECTIVE: Pt reports that she has felt little to no shoulder pain even after work shifts. She has been able to do all exercises with no problem.  ? ?PAIN:  ?Are you having pain? No ? ? ? ? ?TODAY'S TREATMENT:  ?01/14/22 ?Shoulder Front Raises with #7 DB 3 x 10   ?Shoulder Abduction #5 DB 3 x 10  ?OMEGA Face Pulls #15  3 x 10  ?OMEGA Shoulder Rows #35 3 x 10  ?OMEGA Lat Pulls #35 3 x 10  ?Sky Punches #5 DB 3 x 10    ? ?PATIENT EDUCATION: ?Education details: form and technique for appropriate exercise  ?Person educated: Patient ?Education method: Explanation, Demonstration, Verbal cues, and Handouts ?Education comprehension: verbalized understanding, returned demonstration, and verbal cues required ? ? ?HOME EXERCISE PROGRAM: ? ? ?Access Code: [email or web address] ?URL: [email or web address] ?Date: 01/14/2022 ?Prepared by: Bradly Chris ? ?Exercises ?- Seated Upper Trapezius Stretch  - 1 x daily - 7 x weekly - 1 sets - 3 reps - 30 hold ?- Wall Push Up with Plus  - 1 x daily - 4 x weekly - 3 sets - 10 reps ?- Supine  Scapular Protraction in Flexion with Dumbbells  - 1 x daily - 4 x weekly - 3 sets - 10 reps ?- Prone Single Arm Shoulder Y with Dumbbell  - 1 x daily - 4 x weekly - 3 sets - 10 reps ?- Prone Shoulder Horizontal Abduction  - 1 x daily - 4 x weekly - 2 sets - 10 reps ?- Standing Shoulder Flexion to 90 Degrees with Dumbbells  - 1 x daily - 4 x weekly - 3 sets - 10 reps ?- Shoulder Abduction with Dumbbells - Thumbs Up  - 1 x daily - 4 x weekly - 3 sets - 10 reps ? ? ? PT Short Term Goals   ? ?  ? PT SHORT TERM GOAL #1  ? Title Pt to report decreased maximal intensity of pain by 2 NPRS or more.    ? Baseline 01/14/22: NPS consistently 0   ? Time 2   ? Period Weeks   ? Status Achieved   ? Target Date 12/31/21   ?  ? PT SHORT TERM GOAL #2  ? Title Pt to report adherence, compliance, and utility of HEP established for pain management and shoulder strengthening.   ? Baseline 01/14/22: Compliant with HEP   ? Time 3   ? Period Weeks   ? Status Achieved   ? Target Date 01/07/22   ?  ? PT SHORT TERM GOAL #3  ? Title Pt to demonstrate >4 point improvement on FOTO survey to indicated improved perception of ease in basic mobliity for ADL/IADL performance.   ? Time 3   ? Period Weeks   ? Status On-going   ? Target Date 01/07/22   ? ?  ?  ? ?  ? ? ? PT Long Term Goals   ? ?  ? PT LONG TERM GOAL #1  ? Title Pt to reports ability to make it through a work shift without exacerbation of pain in bilat shoulders >2/10.   ? Baseline 01/14/22: Reports 0/10 on NPS after work shift   ? Time 6   ? Period Weeks   ? Status Partially Met   ? Target Date 01/28/22   ?  ? PT LONG TERM GOAL #2  ? Title Pt able to demonstrate proper form and knowledge of advanced HEP for DC to promote continued strength progression in shoulders.   ? Time 6   ? Period Weeks   ? Status On-going   ? Target Date 01/28/22   ? ?  ?  ? ?  ? ? ? Plan   ? ? Clinical Impression Statement Pt continues to exhibit progress with shoulder and parascapular strengthening exercises with ability to perform shoulder frontal and lateral raises with no increase in her neck pain. She is nearing the end of her POC and making significant progress towards her goals. PT continues to modify HEP so that pt will be independent with care by end of POC. Pt is incredibly motivated and compliant.   ? Personal Factors and Comorbidities Age;Fitness;Time since onset of injury/illness/exacerbation   ? Examination-Activity Limitations Reach Overhead;Lift;Carry   ? Examination-Participation Restrictions Occupation   ? Stability/Clinical Decision Making Stable/Uncomplicated   ? Clinical Decision  Making Low   ? Rehab Potential Excellent   ? PT Frequency 2x / week   ? PT Duration 6 weeks   ? PT Treatment/Interventions Electrical Stimulation;Moist Heat;Functional mobility training;Therapeutic activities;Therapeutic exercise;Patient/family education;Dry needling;Passive range of motion   ? PT Next Visit Plan Continue to progress  parascapular exercises and shoulder strengthening exercises. Functional activities training.   ? PT Home Exercise Plan [email or web address]   ? Consulted and Agree with Plan of Care Patient   ? ?  ?  ? ?  ? ? ? ?Bradly Chris PT, DPT  ?Daneil Dan, PT ?01/14/2022, 8:54 AM ? ?  ? ?

## 2022-01-18 ENCOUNTER — Telehealth: Payer: Self-pay | Admitting: Physical Therapy

## 2022-01-18 ENCOUNTER — Ambulatory Visit: Payer: BC Managed Care – PPO | Admitting: Physical Therapy

## 2022-01-18 NOTE — Telephone Encounter (Signed)
Called pt to inquiry about absence from PT.  Could not reach, so left VM instructing pt to call back to check in to reschedule.  ?

## 2022-01-19 ENCOUNTER — Encounter: Payer: Self-pay | Admitting: Family Medicine

## 2022-01-19 ENCOUNTER — Ambulatory Visit (INDEPENDENT_AMBULATORY_CARE_PROVIDER_SITE_OTHER): Payer: BC Managed Care – PPO | Admitting: Family Medicine

## 2022-01-19 ENCOUNTER — Other Ambulatory Visit: Payer: Self-pay

## 2022-01-19 VITALS — BP 120/80 | HR 76 | Ht 67.0 in | Wt 202.0 lb

## 2022-01-19 DIAGNOSIS — B369 Superficial mycosis, unspecified: Secondary | ICD-10-CM | POA: Diagnosis not present

## 2022-01-19 DIAGNOSIS — E669 Obesity, unspecified: Secondary | ICD-10-CM | POA: Diagnosis not present

## 2022-01-19 DIAGNOSIS — Z6831 Body mass index (BMI) 31.0-31.9, adult: Secondary | ICD-10-CM

## 2022-01-19 DIAGNOSIS — S6982XD Other specified injuries of left wrist, hand and finger(s), subsequent encounter: Secondary | ICD-10-CM

## 2022-01-19 DIAGNOSIS — M4722 Other spondylosis with radiculopathy, cervical region: Secondary | ICD-10-CM | POA: Diagnosis not present

## 2022-01-19 DIAGNOSIS — K219 Gastro-esophageal reflux disease without esophagitis: Secondary | ICD-10-CM

## 2022-01-19 DIAGNOSIS — R252 Cramp and spasm: Secondary | ICD-10-CM | POA: Insufficient documentation

## 2022-01-19 NOTE — Assessment & Plan Note (Signed)
Patient states that she is struggled with this, discussion regarding dietary and exercise interventions reviewed including need for developing a caloric deficit.  Additionally, reviewed pharmacologic interventions and she can review coverage information online at [email or web address].  She will start with aggressive dietary/lifestyle changes, we can reevaluate her BMI at her return in 3 months for annual physical. ?

## 2022-01-19 NOTE — Patient Instructions (Signed)
-   Obtain blood work with orders today ?- Start home exercises with information provided ?- Can research coverage at [email or web address] ?- If covered, discuss possible initiation of Wegovy in regards to interactions with your other rheumatology medications with your rheumatologist ?- Return for annual physical in 3 months ?

## 2022-01-19 NOTE — Assessment & Plan Note (Signed)
Near resolution of right lower leg area of tinea corporis, tolerated medication regimen well without issue.  I advised her to utilize this medication in the future if symptoms were to recur and to continue with preventative measures to limit chance of return. ?

## 2022-01-19 NOTE — Assessment & Plan Note (Signed)
She has been noting steady improvement of this area with time, she can follow-up on an as-needed basis for this concern. ?

## 2022-01-19 NOTE — Progress Notes (Signed)
?  ? ?Primary Care / Sports Medicine Office Visit ? ?Patient Information:  ?Patient ID: Brandy Vasquez, female DOB: 21-Sep-1976 Age: 46 y.o. MRN: [id number]  ? ?Brandy Vasquez is a pleasant 46 y.o. female presenting with the following: ? ?Chief Complaint  ?Patient presents with  ? Follow-up  ?  Cervical spondylosis with radiculopathy-Going to PT and its going well ?Gastroesophageal reflux disease without esophagitis- Has not taken meloxicam ?Injury of triangular fibrocartilage complex (TFCC) of left wrist- is getting better ?Tinea corporis- it has resolved ?Fungal infection of skin-states it has resolved.  ?  ? Spasms  ?  Has been getting arm and leg cramps for 3 weeks, mostly while sleeping  ? ? ?Vitals:  ? 01/19/22 1041  ?BP: 120/80  ?Pulse: 76  ?SpO2: 98%  ? ?Vitals:  ? 01/19/22 1041  ?Weight: 202 lb (91.6 kg)  ?Height: 5\' 7"  (1.702 m)  ? ?Body mass index is 31.64 kg/m?. ? ?No results found.  ? ?Independent interpretation of notes and tests performed by another provider:  ? ?None ? ?Procedures performed:  ? ?None ? ?Pertinent History, Exam, Impression, and Recommendations:  ? ?Gastroesophageal reflux disease without esophagitis ?Chronic condition that has now stabilized and is essentially asymptomatic following discontinuation/reserved and infrequent dosing of meloxicam.  She had to discontinue pantoprazole due to comorbid treatment with methotrexate.  That being said, need for pantoprazole has been negated given NSAID decrease. ? ?Chronic condition, stable, Rx management ? ?Cervical spondylosis with radiculopathy ?Chronic condition that, at last visit on 12/08/2021, had been progressively worsening.  She was advised to start formal physical therapy, transition to as needed diclofenac, and maintain close follow-up. ? ?Due to GERD and comorbid treatments by her rheumatologist, she was advised to discontinue NSAIDs, has been tolerating pain which she attributes to physical therapy and methotrexate.  Overall feeling  improved.  At this stage I have advised continued refrain from NSAIDs in general and reserve usage can be used for breakthrough severe symptoms allow.  She is to finish out formal physical therapy, and she can follow-up on an as-needed basis for this issue. ? ?Chronic condition, stable, Rx management ? ?Injury of triangular fibrocartilage complex of left wrist ?She has been noting steady improvement of this area with time, she can follow-up on an as-needed basis for this concern. ? ?Fungal infection of skin ?Near resolution of right lower leg area of tinea corporis, tolerated medication regimen well without issue.  I advised her to utilize this medication in the future if symptoms were to recur and to continue with preventative measures to limit chance of return. ? ?Leg cramps ?Patient has noted new onset bilateral leg cramps, primarily overnight and felt upon awakening, points to her thighs and calfs, denies any overt paresthesias, no weakness, does not awaken her from sleep, no sensation of restless leg, resolves upon awakening and after gentle motion.  She does raise concern over onset of symptoms around the time frame after starting methotrexate. ? ?Examination reveals negative straight leg raise bilaterally, tight posterior chain musculature, positive Faber on the left, equivocal on the right, negative FADIR, resisted straight leg raise elicits left greater than right anterolateral hip pain, focal tenderness symmetrically at the greater trochanteric region bilaterally. ? ?Differential can include adverse effect of methotrexate, serum abnormalities, or sequela of lumbosacral pathology given her examination findings.  I I have ordered labs, provided home-based exercises for her low back/hips, and advised her to touch base with her rheumatologist regarding the symptoms and clinical timeline.  We  will recheck symptoms at her return in 3 months. ? ?Class 1 obesity with serious comorbidity and body mass index (BMI)  of 31.0 to 31.9 in adult ?Patient states that she is struggled with this, discussion regarding dietary and exercise interventions reviewed including need for developing a caloric deficit.  Additionally, reviewed pharmacologic interventions and she can review coverage information online at [email or web address].  She will start with aggressive dietary/lifestyle changes, we can reevaluate her BMI at her return in 3 months for annual physical.  ? ?Orders & Medications ?No orders of the defined types were placed in this encounter. ? ?Orders Placed This Encounter  ?Procedures  ? Vitamin B12  ? Iron, TIBC and Ferritin Panel  ? CBC  ? Comprehensive metabolic panel  ? Magnesium  ?  ? ?Return in about 3 months (around 04/21/2022) for Annual Physical.  ?  ? ?Jerrol Banana, MD ? ? Primary Care Sports Medicine ?[facility] ?[facility]  ? ?

## 2022-01-19 NOTE — Assessment & Plan Note (Signed)
Chronic condition that, at last visit on 12/08/2021, had been progressively worsening.  She was advised to start formal physical therapy, transition to as needed diclofenac, and maintain close follow-up. ? ?Due to GERD and comorbid treatments by her rheumatologist, she was advised to discontinue NSAIDs, has been tolerating pain which she attributes to physical therapy and methotrexate.  Overall feeling improved.  At this stage I have advised continued refrain from NSAIDs in general and reserve usage can be used for breakthrough severe symptoms allow.  She is to finish out formal physical therapy, and she can follow-up on an as-needed basis for this issue. ? ?Chronic condition, stable, Rx management ?

## 2022-01-19 NOTE — Assessment & Plan Note (Signed)
Chronic condition that has now stabilized and is essentially asymptomatic following discontinuation/reserved and infrequent dosing of meloxicam.  She had to discontinue pantoprazole due to comorbid treatment with methotrexate.  That being said, need for pantoprazole has been negated given NSAID decrease. ? ?Chronic condition, stable, Rx management ?

## 2022-01-19 NOTE — Assessment & Plan Note (Signed)
Patient has noted new onset bilateral leg cramps, primarily overnight and felt upon awakening, points to her thighs and calfs, denies any overt paresthesias, no weakness, does not awaken her from sleep, no sensation of restless leg, resolves upon awakening and after gentle motion.  She does raise concern over onset of symptoms around the time frame after starting methotrexate. ? ?Examination reveals negative straight leg raise bilaterally, tight posterior chain musculature, positive Faber on the left, equivocal on the right, negative FADIR, resisted straight leg raise elicits left greater than right anterolateral hip pain, focal tenderness symmetrically at the greater trochanteric region bilaterally. ? ?Differential can include adverse effect of methotrexate, serum abnormalities, or sequela of lumbosacral pathology given her examination findings.  I I have ordered labs, provided home-based exercises for her low back/hips, and advised her to touch base with her rheumatologist regarding the symptoms and clinical timeline.  We will recheck symptoms at her return in 3 months. ?

## 2022-01-20 ENCOUNTER — Encounter: Payer: Self-pay | Admitting: Family Medicine

## 2022-01-20 ENCOUNTER — Ambulatory Visit: Payer: BC Managed Care – PPO | Admitting: Physical Therapy

## 2022-01-20 ENCOUNTER — Telehealth: Payer: Self-pay | Admitting: Physical Therapy

## 2022-01-20 LAB — COMPREHENSIVE METABOLIC PANEL
ALT: 21 IU/L (ref 0–32)
AST: 16 IU/L (ref 0–40)
Albumin/Globulin Ratio: 1.7 (ref 1.2–2.2)
Albumin: 4.4 g/dL (ref 3.8–4.8)
Alkaline Phosphatase: 95 IU/L (ref 44–121)
BUN/Creatinine Ratio: 15 (ref 9–23)
BUN: 14 mg/dL (ref 6–24)
Bilirubin Total: 1 mg/dL (ref 0.0–1.2)
CO2: 26 mmol/L (ref 20–29)
Calcium: 9.2 mg/dL (ref 8.7–10.2)
Chloride: 104 mmol/L (ref 96–106)
Creatinine, Ser: 0.92 mg/dL (ref 0.57–1.00)
Globulin, Total: 2.6 g/dL (ref 1.5–4.5)
Glucose: 68 mg/dL — ABNORMAL LOW (ref 70–99)
Potassium: 4.2 mmol/L (ref 3.5–5.2)
Sodium: 142 mmol/L (ref 134–144)
Total Protein: 7 g/dL (ref 6.0–8.5)
eGFR: 79 mL/min/{1.73_m2} (ref >59)

## 2022-01-20 LAB — CBC
Hematocrit: 41.6 % (ref 34.0–46.6)
Hemoglobin: 13.7 g/dL (ref 11.1–15.9)
MCH: 31.7 pg (ref 26.6–33.0)
MCHC: 32.9 g/dL (ref 31.5–35.7)
MCV: 96 fL (ref 79–97)
Platelets: 329 10*3/uL (ref 150–450)
RBC: 4.32 x10E6/uL (ref 3.77–5.28)
RDW: 11.7 % (ref 11.7–15.4)
WBC: 9.4 10*3/uL (ref 3.4–10.8)

## 2022-01-20 LAB — MAGNESIUM: Magnesium: 2.1 mg/dL (ref 1.6–2.3)

## 2022-01-20 LAB — IRON,TIBC AND FERRITIN PANEL
Ferritin: 108 ng/mL (ref 15–150)
Iron Saturation: 26 % (ref 15–55)
Iron: 67 ug/dL (ref 27–159)
Total Iron Binding Capacity: 253 ug/dL (ref 250–450)
UIBC: 186 ug/dL (ref 131–425)

## 2022-01-20 LAB — VITAMIN B12: Vitamin B-12: 645 pg/mL (ref 232–1245)

## 2022-01-20 NOTE — Telephone Encounter (Signed)
Please review.  KP

## 2022-01-20 NOTE — Telephone Encounter (Signed)
Called pt to inquiry about absence from PT apt. Did not pick up so left VM instructing pt to call back to reschedule and check in with clinic. ?

## 2022-01-21 ENCOUNTER — Other Ambulatory Visit: Payer: Self-pay

## 2022-01-21 DIAGNOSIS — E669 Obesity, unspecified: Secondary | ICD-10-CM

## 2022-01-21 MED ORDER — SEMAGLUTIDE-WEIGHT MANAGEMENT 0.25 MG/0.5ML ~~LOC~~ SOAJ: 0.2500 mg | SUBCUTANEOUS | 0 refills | Status: DC

## 2022-01-25 ENCOUNTER — Ambulatory Visit: Payer: BC Managed Care – PPO | Attending: Family Medicine | Admitting: Physical Therapy

## 2022-01-27 ENCOUNTER — Ambulatory Visit: Payer: BC Managed Care – PPO | Admitting: Physical Therapy

## 2022-02-04 DIAGNOSIS — M0579 Rheumatoid arthritis with rheumatoid factor of multiple sites without organ or systems involvement: Secondary | ICD-10-CM | POA: Diagnosis not present

## 2022-02-04 DIAGNOSIS — Z7960 Long term (current) use of unspecified immunomodulators and immunosuppressants: Secondary | ICD-10-CM | POA: Diagnosis not present

## 2022-02-05 DIAGNOSIS — N644 Mastodynia: Secondary | ICD-10-CM | POA: Insufficient documentation

## 2022-02-05 DIAGNOSIS — Z9889 Other specified postprocedural states: Secondary | ICD-10-CM | POA: Diagnosis not present

## 2022-02-09 DIAGNOSIS — R928 Other abnormal and inconclusive findings on diagnostic imaging of breast: Secondary | ICD-10-CM | POA: Diagnosis not present

## 2022-02-09 DIAGNOSIS — N644 Mastodynia: Secondary | ICD-10-CM | POA: Diagnosis not present

## 2022-03-06 ENCOUNTER — Other Ambulatory Visit: Payer: Self-pay | Admitting: Family Medicine

## 2022-03-06 DIAGNOSIS — E669 Obesity, unspecified: Secondary | ICD-10-CM

## 2022-03-08 ENCOUNTER — Other Ambulatory Visit: Payer: Self-pay

## 2022-03-08 ENCOUNTER — Other Ambulatory Visit: Payer: Self-pay | Admitting: Family Medicine

## 2022-03-08 DIAGNOSIS — E669 Obesity, unspecified: Secondary | ICD-10-CM

## 2022-03-08 MED ORDER — SEMAGLUTIDE-WEIGHT MANAGEMENT 0.5 MG/0.5ML ~~LOC~~ SOAJ: 0.5000 mg | SUBCUTANEOUS | 0 refills | Status: DC

## 2022-03-09 ENCOUNTER — Encounter: Payer: Self-pay | Admitting: Family Medicine

## 2022-03-09 NOTE — Telephone Encounter (Signed)
Requested medication (s) are due for refill today: no ? ?Requested medication (s) are on the active medication list: yes ? ?Last refill:  yesterday ? ?Future visit scheduled: yes ? ?Notes to clinic:  Pharmacy comment: Alternative Requested:PRODUCT SERVICE NOT COIVERED. ? ? ?  ?Requested Prescriptions  ?Pending Prescriptions Disp Refills  ? WEGOVY 0.5 MG/0.5ML SOAJ [Pharmacy Med Name: WEGOVY 0.5 MG/0.5 ML PEN]  0  ?  Sig: INJECT 0.5 MG INTO THE SKIN ONCE A WEEK.  ?  ? Endocrinology:  Diabetes - GLP-1 Receptor Agonists - semaglutide Failed - 03/08/2022 11:11 AM  ?  ?  Failed - HBA1C in normal range and within 180 days  ?  No results found for: HGBA1C, LABA1C   ?  ?  Passed - Cr in normal range and within 360 days  ?  Creat  ?Date Value Ref Range Status  ?04/07/2021 0.90 0.50 - 1.10 mg/dL Final  ? ?Creatinine, Ser  ?Date Value Ref Range Status  ?01/19/2022 0.92 0.57 - 1.00 mg/dL Final  ?   ?  ?  Passed - Valid encounter within last 6 months  ?  Recent Outpatient Visits   ? ?      ? 1 month ago Leg cramps  ? [facility] Jerrol Banana, MD  ? 3 months ago Cervical spondylosis with radiculopathy  ? [facility] Jerrol Banana, MD  ? 5 months ago Cervical spondylosis  ? [facility] Jerrol Banana, MD  ? 6 months ago Annual physical exam  ? [facility] Jerrol Banana, MD  ? 7 months ago Hemorrhagic cystitis  ? [facility] Jerrol Banana, MD  ? ?  ?  ?Future Appointments   ? ?        ? In 1 month Ashley Royalty, Ocie Bob, MD [facility]  ? In 5 months Rice, Jamesetta Orleans, MD [facility]  ? ?  ? ? ?  ?  ?  ? ?

## 2022-03-09 NOTE — Telephone Encounter (Signed)
Requested medications are due for refill today.  no ? ?Requested medications are on the active medications list.  no ? ?Last refill. 01/21/2022 81mL 0 refills ? ?Future visit scheduled.   yes ? ?Notes to clinic.  Medication was discontinued. No protocol for refill. ? ? ? ?Requested Prescriptions  ?Pending Prescriptions Disp Refills  ? OZEMPIC, 0.25 OR 0.5 MG/DOSE, 2 MG/3ML SOPN [Pharmacy Med Name: OZEMPIC 0.25-0.5 MG/DOSE PEN]    ?  Sig: INJECT 0.25MG  INTO THE SKIN ONE TIME PER WEEK  ?  ? There is no refill protocol information for this order  ?  ?  ?

## 2022-03-10 ENCOUNTER — Other Ambulatory Visit: Payer: Self-pay | Admitting: Family Medicine

## 2022-03-10 DIAGNOSIS — E669 Obesity, unspecified: Secondary | ICD-10-CM

## 2022-03-10 IMAGING — CR DG WRIST COMPLETE 3+V*L*
4 series · 4 of 4 positions shown · non-contrast
Comparison: None.

CLINICAL DATA: Chronic bilateral hand and wrist pain. Patient
reports recent diagnosis of rheumatoid arthritis.

EXAM:
LEFT WRIST - COMPLETE 3+ VIEW

[wrist pa]
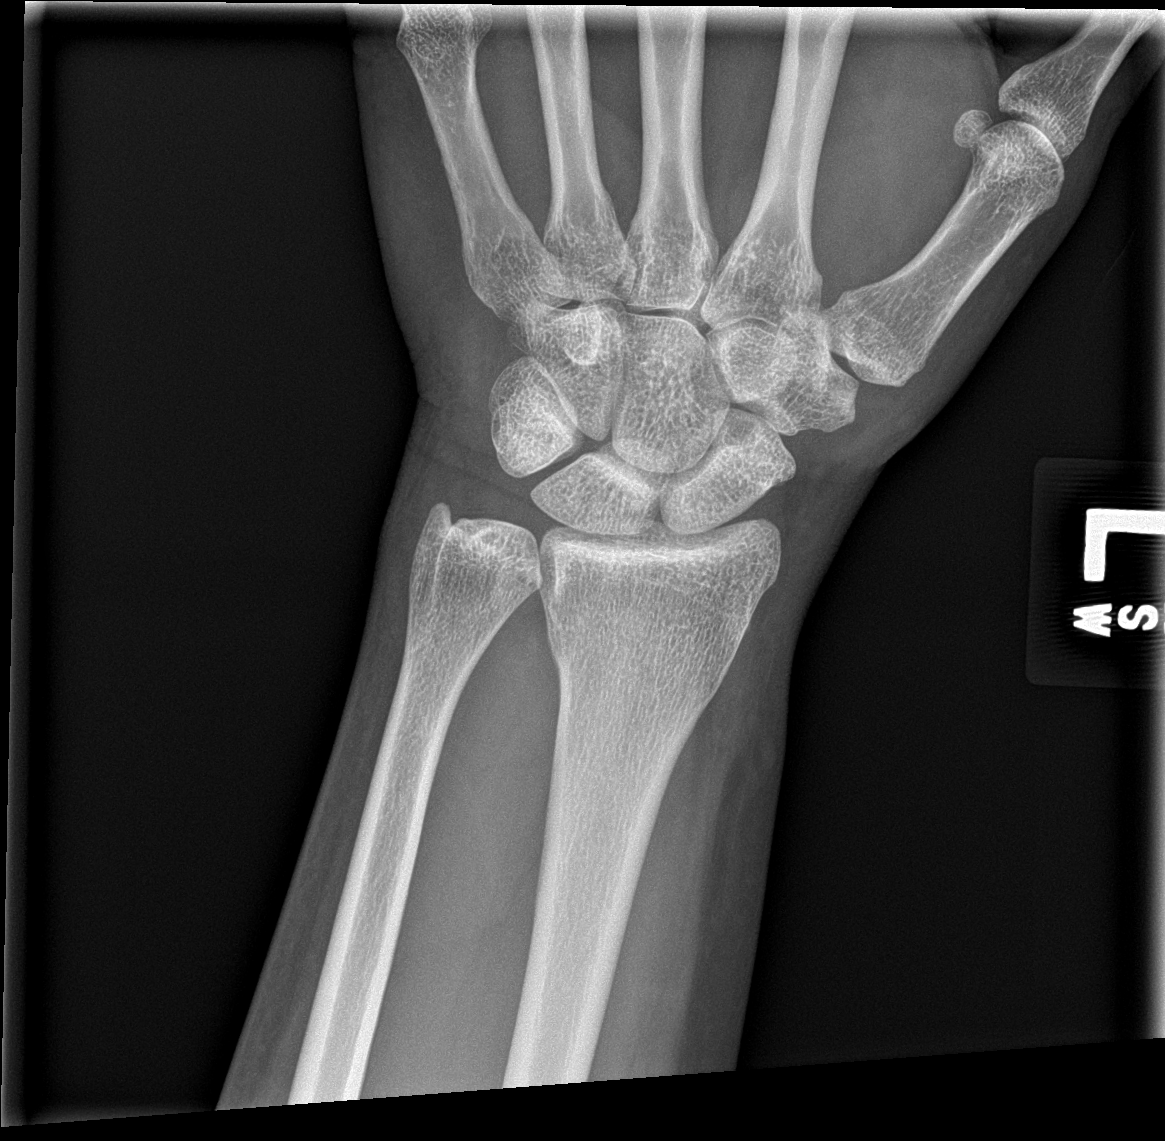

[wrist obl]
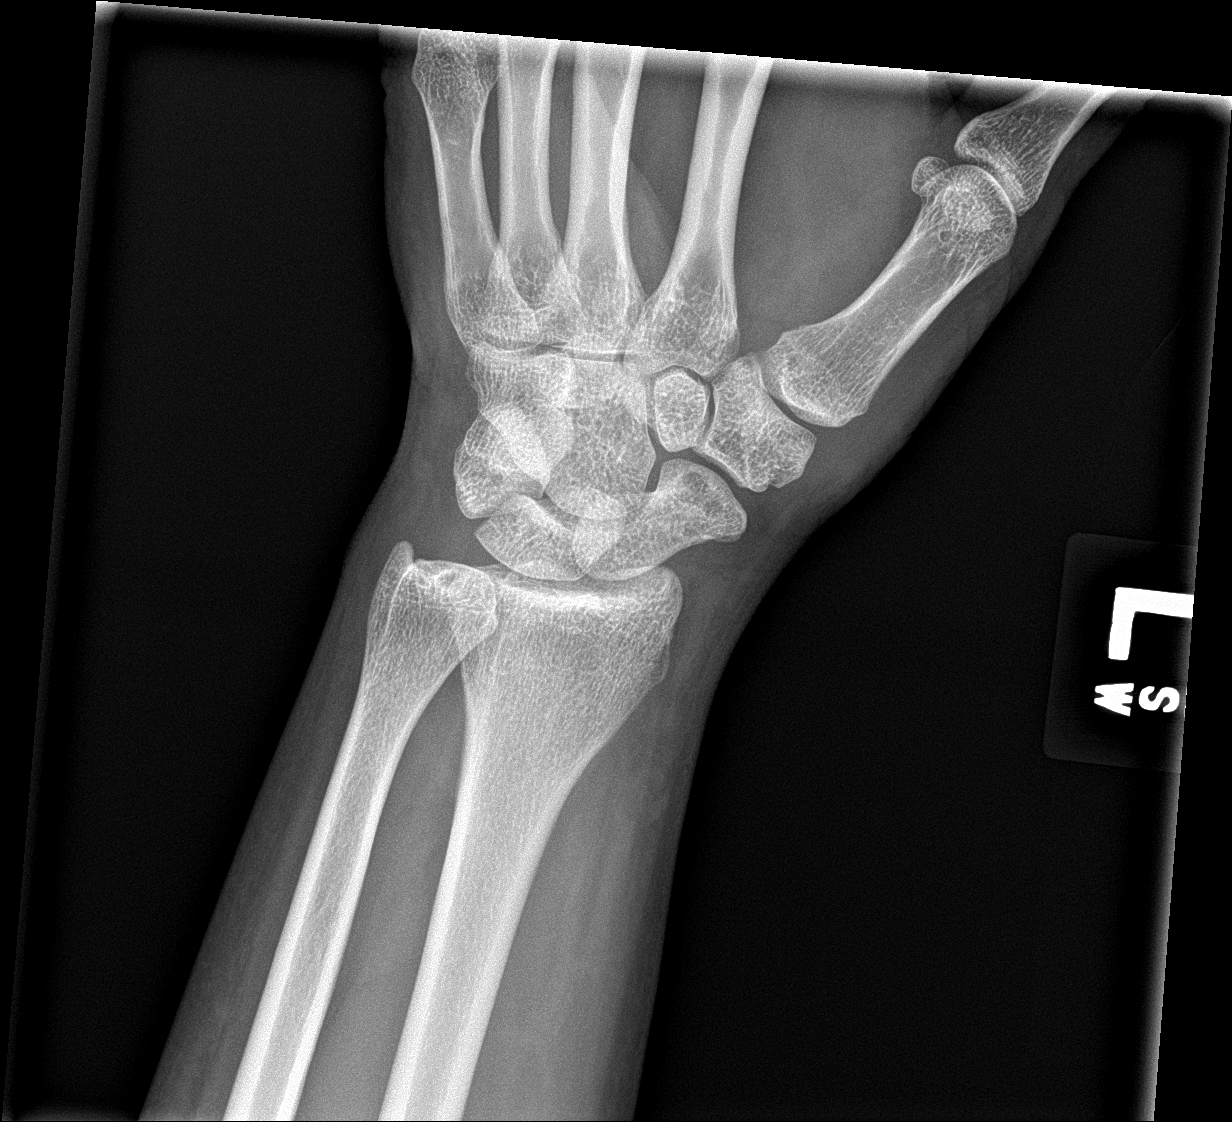

[wrist lat]
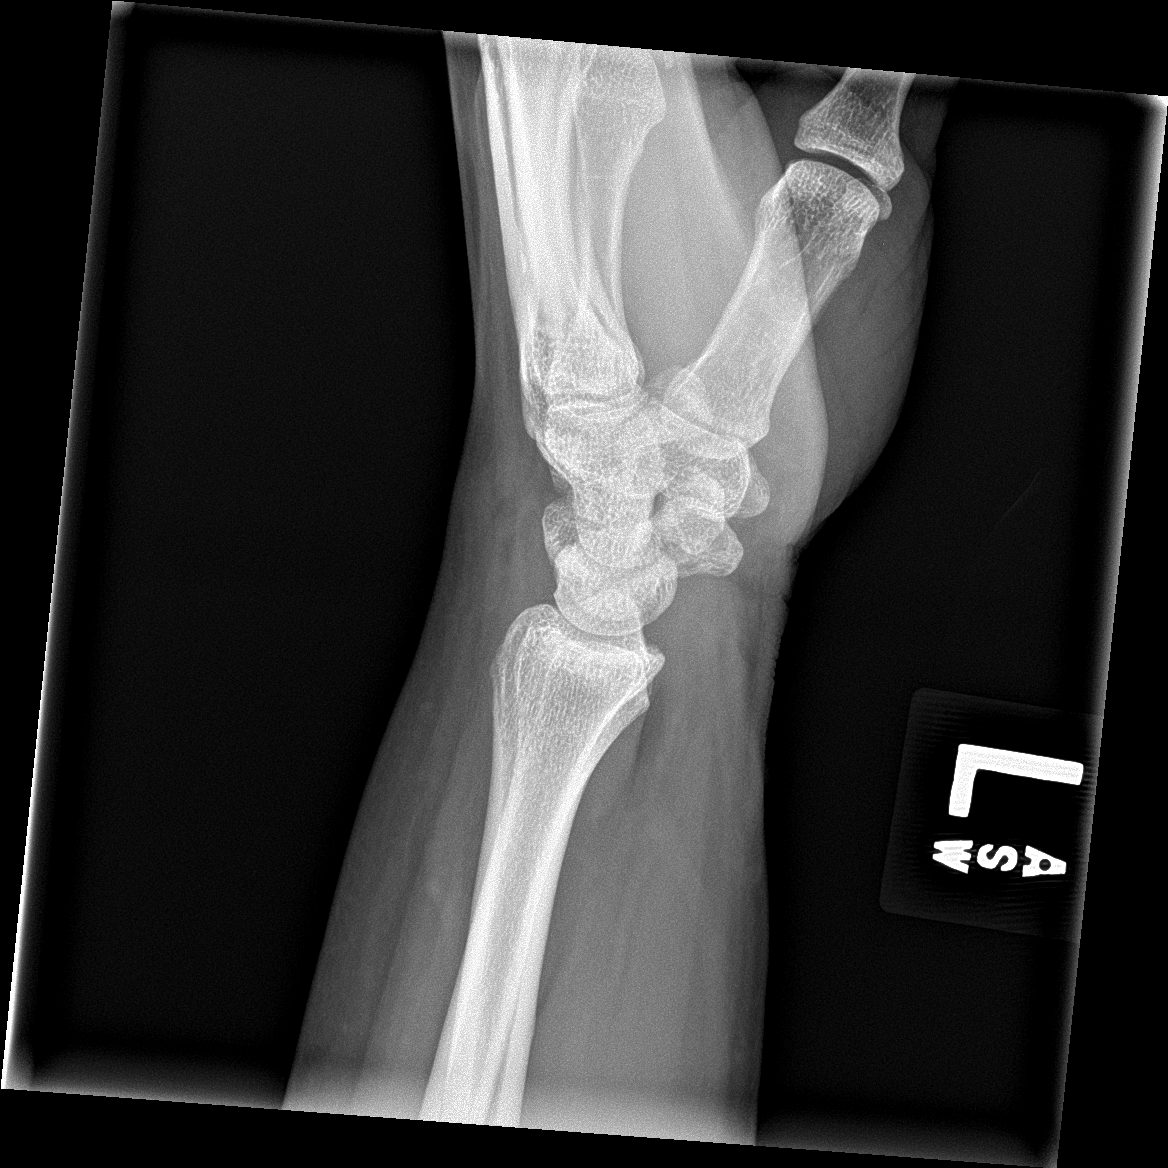

[wrist navicular]
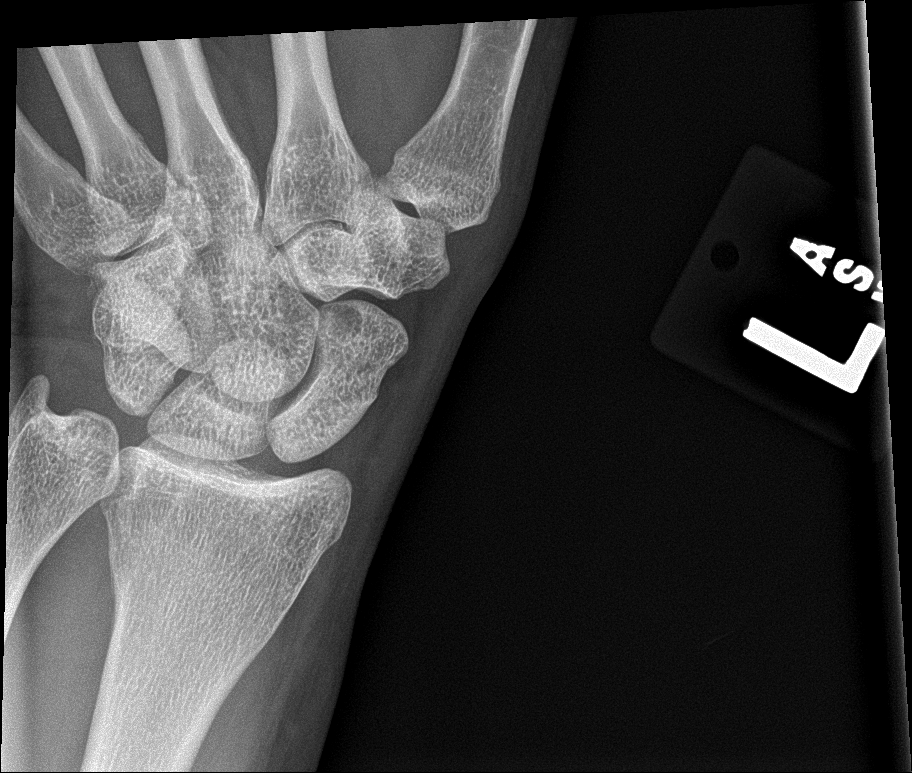

[4 of 4 positions shown; findings below may reference images not displayed]

FINDINGS: Bone mineralization is subjectively normal. Normal alignment. Normal
joint spaces. There carpal bone cysts involving the distal scaphoid,
no definite erosions. No periosteal reaction. No fracture. No soft
tissue calcifications or focal soft tissue abnormality.
IMPRESSION: Small carpal bone cysts involving the distal scaphoid, typically
degenerative. No evidence of inflammatory arthropathy.

## 2022-03-11 ENCOUNTER — Other Ambulatory Visit: Payer: Self-pay | Admitting: Family Medicine

## 2022-03-11 DIAGNOSIS — E669 Obesity, unspecified: Secondary | ICD-10-CM

## 2022-03-11 NOTE — Telephone Encounter (Signed)
Requested medication (s) are due for refill today: yes  Requested medication (s) are on the active medication list: no  Last refill:  03/08/22  Future visit scheduled: yes  Notes to clinic:  Unable to refill per protocol, Rx was discontinued 03/08/22 due to change in therapy. There is not a protocol attached to the medication request.     Requested Prescriptions  Pending Prescriptions Disp Refills   OZEMPIC, 0.25 OR 0.5 MG/DOSE, 2 MG/3ML SOPN [Pharmacy Med Name: OZEMPIC 0.25-0.5 MG/DOSE PEN]      Sig: INJECT 0.25MG  INTO THE SKIN ONE TIME PER WEEK     There is no refill protocol information for this order

## 2022-03-12 ENCOUNTER — Other Ambulatory Visit: Payer: Self-pay

## 2022-03-12 MED ORDER — OZEMPIC (0.25 OR 0.5 MG/DOSE) 2 MG/3ML ~~LOC~~ SOPN: 0.2500 mg | PEN_INJECTOR | SUBCUTANEOUS | 0 refills | Status: DC

## 2022-03-12 NOTE — Telephone Encounter (Signed)
Requested by interface surescripts. 0.25 mg . mg discontinued changed to 0.5 mg Requested Prescriptions  Refused Prescriptions Disp Refills  . OZEMPIC, 0.25 OR 0.5 MG/DOSE, 2 MG/3ML SOPN [Pharmacy Med Name: OZEMPIC 0.25-0.5 MG/DOSE PEN]      Sig: INJECT 0.25MG  INTO THE SKIN ONE TIME PER WEEK     There is no refill protocol information for this order

## 2022-03-12 NOTE — Telephone Encounter (Signed)
Please review. OK to send semaglutide?  KP

## 2022-03-21 ENCOUNTER — Other Ambulatory Visit: Payer: Self-pay | Admitting: Family Medicine

## 2022-04-02 DIAGNOSIS — M0579 Rheumatoid arthritis with rheumatoid factor of multiple sites without organ or systems involvement: Secondary | ICD-10-CM | POA: Diagnosis not present

## 2022-04-02 DIAGNOSIS — M7918 Myalgia, other site: Secondary | ICD-10-CM | POA: Diagnosis not present

## 2022-04-02 DIAGNOSIS — Z7960 Long term (current) use of unspecified immunomodulators and immunosuppressants: Secondary | ICD-10-CM | POA: Diagnosis not present

## 2022-04-21 ENCOUNTER — Encounter: Payer: BC Managed Care – PPO | Admitting: Family Medicine

## 2022-04-28 ENCOUNTER — Other Ambulatory Visit: Payer: Self-pay | Admitting: Family Medicine

## 2022-04-28 DIAGNOSIS — E669 Obesity, unspecified: Secondary | ICD-10-CM

## 2022-04-29 NOTE — Telephone Encounter (Signed)
Requested medication (s) are due for refill today: yes  Requested medication (s) are on the active medication list: yes  Last refill:  03/12/22 for 0.25mg , 0.5mg  ordered on 03/08/22  Future visit scheduled: yes  Notes to clinic:  Unable to refill per protocol, medication not assigned to the refill protocol.    Requested Prescriptions  Pending Prescriptions Disp Refills   Semaglutide,0.25 or 0.5MG /DOS, (OZEMPIC, 0.25 OR 0.5 MG/DOSE,) 2 MG/3ML SOPN [Pharmacy Med Name: OZEMPIC 0.25-0.5 MG/DOSE PEN] 3 mL     Sig: Inject 0.5 mg into the skin once a week.     There is no refill protocol information for this order

## 2022-05-11 ENCOUNTER — Encounter: Payer: Self-pay | Admitting: Family Medicine

## 2022-05-11 ENCOUNTER — Ambulatory Visit (INDEPENDENT_AMBULATORY_CARE_PROVIDER_SITE_OTHER): Payer: BC Managed Care – PPO | Admitting: Family Medicine

## 2022-05-11 VITALS — BP 120/80 | HR 88 | Ht 66.0 in | Wt 192.2 lb

## 2022-05-11 DIAGNOSIS — N3090 Cystitis, unspecified without hematuria: Secondary | ICD-10-CM

## 2022-05-11 DIAGNOSIS — Z6831 Body mass index (BMI) 31.0-31.9, adult: Secondary | ICD-10-CM

## 2022-05-11 DIAGNOSIS — E669 Obesity, unspecified: Secondary | ICD-10-CM

## 2022-05-11 DIAGNOSIS — R300 Dysuria: Secondary | ICD-10-CM

## 2022-05-11 LAB — POCT URINALYSIS DIPSTICK
Bilirubin, UA: NEGATIVE
Blood, UA: NEGATIVE
Glucose, UA: NEGATIVE
Ketones, UA: NEGATIVE
Nitrite, UA: POSITIVE
Protein, UA: POSITIVE — AB
Spec Grav, UA: 1.025 (ref 1.010–1.025)
Urobilinogen, UA: >=8 E.U./dL — AB
pH, UA: 7 (ref 5.0–8.0)

## 2022-05-11 MED ORDER — WEGOVY 1 MG/0.5ML ~~LOC~~ SOAJ: 1.0000 mg | SUBCUTANEOUS | 0 refills | Status: DC

## 2022-05-11 MED ORDER — SEMAGLUTIDE-WEIGHT MANAGEMENT 1 MG/0.5ML ~~LOC~~ SOAJ: 1.0000 mg | SUBCUTANEOUS | 0 refills | Status: DC

## 2022-05-11 MED ORDER — NITROFURANTOIN MONOHYD MACRO 100 MG PO CAPS: 100.0000 mg | ORAL_CAPSULE | Freq: Two times a day (BID) | ORAL | 0 refills | Status: DC

## 2022-05-11 NOTE — Progress Notes (Signed)
     Primary Care / Sports Medicine Office Visit  Patient Information:  Patient ID: Brandy Vasquez, female DOB: 05/14/1976 Age: 46 y.o. MRN: [id number]   Brandy Vasquez is a pleasant 46 y.o. female presenting with the following:  Chief Complaint  Patient presents with   Medication Refill   Urinary Tract Infection    Pain for about 1 week now with urination.     Vitals:   05/11/22 1408  BP: 120/80  Pulse: 88  SpO2: 98%   Vitals:   05/11/22 1408  Weight: 192 lb 3.2 oz (87.2 kg)  Height: 5\' 6"  (1.676 m)   Body mass index is 31.02 kg/m.  No results found.   Independent interpretation of notes and tests performed by another provider:   None  Procedures performed:   None  Pertinent History, Exam, Impression, and Recommendations:   Problem List Items Addressed This Visit       Genitourinary   Cystitis    1 week history of mild dysuria, darkened urine in the setting of ongoing prednisone usage.  UA reviewed, plan for Macrobid, will send urine for culture as well.      Relevant Medications   nitrofurantoin, macrocrystal-monohydrate, (MACROBID) 100 MG capsule   Other Relevant Orders   POCT urinalysis dipstick (Completed)   Urine Culture     Other   Class 1 obesity with serious comorbidity and body mass index (BMI) of 31.0 to 31.9 in adult - Primary    Patient has been tolerating semaglutide 0.5 mg, has noted steady weight loss, no significant side effects other than expected early satiety.  Plan for further titration to 1 mg weekly, follow-up in 2 months.  She is to contact in 3 weeks for new Rx.      Relevant Medications   Semaglutide-Weight Management 1 MG/0.5ML SOAJ   Other Visit Diagnoses     Dysuria            Orders & Medications Meds ordered this encounter  Medications   DISCONTD: WEGOVY 1 MG/0.5ML SOAJ    Sig: Inject 1 mg into the skin once a week. Use this dose for 1 month (4 shots) and then increase to next higher dose.    Dispense:  2 mL     Refill:  0   Semaglutide-Weight Management 1 MG/0.5ML SOAJ    Sig: Inject 1 mg into the skin once a week for 4 doses.    Dispense:  2 mL    Refill:  0   nitrofurantoin, macrocrystal-monohydrate, (MACROBID) 100 MG capsule    Sig: Take 1 capsule (100 mg total) by mouth 2 (two) times daily.    Dispense:  14 capsule    Refill:  0   Orders Placed This Encounter  Procedures   Urine Culture   POCT urinalysis dipstick     Return in about 2 months (around 07/12/2022).     07/14/2022, MD   Primary Care Sports Medicine [facility]

## 2022-05-11 NOTE — Assessment & Plan Note (Signed)
Patient has been tolerating semaglutide 0.5 mg, has noted steady weight loss, no significant side effects other than expected early satiety.  Plan for further titration to 1 mg weekly, follow-up in 2 months.  She is to contact us in 3 weeks for new Rx.

## 2022-05-11 NOTE — Assessment & Plan Note (Signed)
1 week history of mild dysuria, darkened urine in the setting of ongoing prednisone usage.  UA reviewed, plan for Macrobid, will send urine for culture as well.

## 2022-05-12 ENCOUNTER — Other Ambulatory Visit: Payer: Self-pay | Admitting: Family Medicine

## 2022-05-12 DIAGNOSIS — E669 Obesity, unspecified: Secondary | ICD-10-CM

## 2022-05-12 DIAGNOSIS — R300 Dysuria: Secondary | ICD-10-CM | POA: Diagnosis not present

## 2022-05-15 LAB — URINE CULTURE

## 2022-05-15 LAB — SPECIMEN STATUS REPORT

## 2022-05-18 ENCOUNTER — Other Ambulatory Visit: Payer: Self-pay

## 2022-05-18 ENCOUNTER — Encounter: Payer: Self-pay | Admitting: Family Medicine

## 2022-05-18 DIAGNOSIS — E669 Obesity, unspecified: Secondary | ICD-10-CM

## 2022-05-18 MED ORDER — SEMAGLUTIDE-WEIGHT MANAGEMENT 1 MG/0.5ML ~~LOC~~ SOAJ: 1.0000 mg | SUBCUTANEOUS | 0 refills | Status: AC

## 2022-05-18 MED ORDER — SEMAGLUTIDE-WEIGHT MANAGEMENT 1 MG/0.5ML ~~LOC~~ SOAJ: 1.0000 mg | SUBCUTANEOUS | 0 refills | Status: DC

## 2022-05-19 DIAGNOSIS — M0579 Rheumatoid arthritis with rheumatoid factor of multiple sites without organ or systems involvement: Secondary | ICD-10-CM | POA: Diagnosis not present

## 2022-05-22 DIAGNOSIS — Z719 Counseling, unspecified: Secondary | ICD-10-CM | POA: Diagnosis not present

## 2022-05-22 DIAGNOSIS — T50905A Adverse effect of unspecified drugs, medicaments and biological substances, initial encounter: Secondary | ICD-10-CM | POA: Diagnosis not present

## 2022-06-16 DIAGNOSIS — M0579 Rheumatoid arthritis with rheumatoid factor of multiple sites without organ or systems involvement: Secondary | ICD-10-CM | POA: Diagnosis not present

## 2022-06-21 DIAGNOSIS — H524 Presbyopia: Secondary | ICD-10-CM | POA: Diagnosis not present

## 2022-07-12 ENCOUNTER — Encounter: Payer: Self-pay | Admitting: Family Medicine

## 2022-07-12 ENCOUNTER — Ambulatory Visit (INDEPENDENT_AMBULATORY_CARE_PROVIDER_SITE_OTHER): Payer: BC Managed Care – PPO | Admitting: Family Medicine

## 2022-07-12 ENCOUNTER — Other Ambulatory Visit: Payer: Self-pay

## 2022-07-12 VITALS — BP 118/78 | HR 88 | Ht 66.0 in | Wt 196.0 lb

## 2022-07-12 DIAGNOSIS — M25552 Pain in left hip: Secondary | ICD-10-CM | POA: Diagnosis not present

## 2022-07-12 DIAGNOSIS — Z6831 Body mass index (BMI) 31.0-31.9, adult: Secondary | ICD-10-CM

## 2022-07-12 DIAGNOSIS — E669 Obesity, unspecified: Secondary | ICD-10-CM

## 2022-07-12 MED ORDER — PHENTERMINE HCL 15 MG PO CAPS: 15.0000 mg | ORAL_CAPSULE | Freq: Every day | ORAL | 0 refills | Status: DC

## 2022-07-12 NOTE — Patient Instructions (Signed)
-   Start phentermine daily before breakfast (at the start of your day) - Perform exercises for back and hip with information provided - Can dose over-the-counter anti-inflammatories (ibuprofen, naproxen) on as-needed basis - Review information provided on healthy lifestyle changes - Return for follow-up in 1 month, contact for questions between now and then

## 2022-07-12 NOTE — Progress Notes (Signed)
     Primary Care / Sports Medicine Office Visit  Patient Information:  Patient ID: Brandy Vasquez, female DOB: 03-21-1976 Age: 46 y.o. MRN: [id number]   Brandy Vasquez is a pleasant 46 y.o. female presenting with the following:  Chief Complaint  Patient presents with   Weight Check    Has not been about to get medication filled.     Vitals:   07/12/22 0859  BP: 118/78  Pulse: 88  SpO2: 98%   Vitals:   07/12/22 0859  Weight: 196 lb (88.9 kg)  Height: 5\' 6"  (1.676 m)   Body mass index is 31.64 kg/m.  No results found.   Independent interpretation of notes and tests performed by another provider:   None  Procedures performed:   None  Pertinent History, Exam, Impression, and Recommendations:   Problem List Items Addressed This Visit       Other   Class 1 obesity with serious comorbidity and body mass index (BMI) of 31.0 to 31.9 in adult - Primary    Chronic condition, previously had been approved for semaglutide, has been unable to continue this due to insurance related barriers.  Still interested in weight loss particularly given the left hip pain she describes as well.  We discussed weight loss specifics at length, both pharmacologic and nonpharmacologic management options, and she will initiate phentermine at 15 mg daily upon awakening (she works night shifts).  She will return for follow-up in 1 month to assess her response, tolerability, and can consider further titration.      Relevant Medications   phentermine 15 MG capsule   Arthralgia of left hip    Left hip anterolateral pain without radiation, aggravated with weightbearing, denies any paresthesias, rheumatologist has started her on gabapentin which she feels has been effective.  Examination significant for positive FADIR recreating symptoms, FABER providing medial thigh discomfort, she has tenderness at the greater trochanteric region.  Clinical history, course, response to medications, and findings are most  consistent with left hip arthralgia, cannot exclude an element of degenerative changes however this can be considered secondary/compensatory to lumbosacral issues given the findings at the greater trochanter.  I have advised a trial of OTC NSAIDs, home-based dedicated rehab for spine and hip conditioning, and can follow-up on this issue at her return in 1 month.  We can discuss obtaining x-rays, and pharmacotherapeutic options pending persistent symptoms.        Orders & Medications Meds ordered this encounter  Medications   phentermine 15 MG capsule    Sig: Take 1 capsule (15 mg total) by mouth daily before breakfast.    Dispense:  30 capsule    Refill:  0   No orders of the defined types were placed in this encounter.    Return in about 4 weeks (around 08/09/2022).     Montel Culver, MD   Primary Care Sports Medicine [place]

## 2022-07-12 NOTE — Assessment & Plan Note (Signed)
Left hip anterolateral pain without radiation, aggravated with weightbearing, denies any paresthesias, rheumatologist has started her on gabapentin which she feels has been effective.  Examination significant for positive FADIR recreating symptoms, FABER providing medial thigh discomfort, she has tenderness at the greater trochanteric region.  Clinical history, course, response to medications, and findings are most consistent with left hip arthralgia, cannot exclude an element of degenerative changes however this can be considered secondary/compensatory to lumbosacral issues given the findings at the greater trochanter.  I have advised a trial of OTC NSAIDs, home-based dedicated rehab for spine and hip conditioning, and can follow-up on this issue at her return in 1 month.  We can discuss obtaining x-rays, and pharmacotherapeutic options pending persistent symptoms.

## 2022-07-12 NOTE — Assessment & Plan Note (Signed)
Chronic condition, previously had been approved for semaglutide, has been unable to continue this due to insurance related barriers.  Still interested in weight loss particularly given the left hip pain she describes as well.  We discussed weight loss specifics at length, both pharmacologic and nonpharmacologic management options, and she will initiate phentermine at 15 mg daily upon awakening (she works night shifts).  She will return for follow-up in 1 month to assess her response, tolerability, and can consider further titration.

## 2022-07-14 DIAGNOSIS — M0579 Rheumatoid arthritis with rheumatoid factor of multiple sites without organ or systems involvement: Secondary | ICD-10-CM | POA: Diagnosis not present

## 2022-07-14 DIAGNOSIS — Z7960 Long term (current) use of unspecified immunomodulators and immunosuppressants: Secondary | ICD-10-CM | POA: Diagnosis not present

## 2022-07-14 DIAGNOSIS — M25552 Pain in left hip: Secondary | ICD-10-CM | POA: Diagnosis not present

## 2022-07-21 ENCOUNTER — Encounter: Payer: Self-pay | Admitting: Family Medicine

## 2022-07-22 ENCOUNTER — Ambulatory Visit: Payer: BC Managed Care – PPO | Admitting: Family Medicine

## 2022-07-29 ENCOUNTER — Encounter: Payer: Self-pay | Admitting: Family Medicine

## 2022-08-09 ENCOUNTER — Encounter: Payer: Self-pay | Admitting: Family Medicine

## 2022-08-09 ENCOUNTER — Ambulatory Visit (INDEPENDENT_AMBULATORY_CARE_PROVIDER_SITE_OTHER): Payer: BC Managed Care – PPO | Admitting: Family Medicine

## 2022-08-09 ENCOUNTER — Other Ambulatory Visit (HOSPITAL_COMMUNITY)
Admission: RE | Admit: 2022-08-09 | Discharge: 2022-08-09 | Disposition: A | Discharge status: Home / Self Care | Payer: BC Managed Care – PPO | Source: Ambulatory Visit | Attending: Family Medicine | Admitting: Family Medicine

## 2022-08-09 VITALS — BP 120/80 | HR 76 | Ht 66.0 in | Wt 191.8 lb

## 2022-08-09 DIAGNOSIS — I10 Essential (primary) hypertension: Secondary | ICD-10-CM

## 2022-08-09 DIAGNOSIS — R8290 Unspecified abnormal findings in urine: Secondary | ICD-10-CM | POA: Insufficient documentation

## 2022-08-09 DIAGNOSIS — Z6831 Body mass index (BMI) 31.0-31.9, adult: Secondary | ICD-10-CM

## 2022-08-09 DIAGNOSIS — E669 Obesity, unspecified: Secondary | ICD-10-CM | POA: Diagnosis not present

## 2022-08-09 DIAGNOSIS — Z7689 Persons encountering health services in other specified circumstances: Secondary | ICD-10-CM | POA: Diagnosis not present

## 2022-08-09 DIAGNOSIS — K219 Gastro-esophageal reflux disease without esophagitis: Secondary | ICD-10-CM

## 2022-08-09 MED ORDER — PHENTERMINE HCL 15 MG PO CAPS: 15.0000 mg | ORAL_CAPSULE | Freq: Every day | ORAL | 0 refills | Status: DC

## 2022-08-09 NOTE — Assessment & Plan Note (Signed)
Stable readings, no cardiopulmonary symptoms.  Tolerating phentermine 15 mg well.

## 2022-08-09 NOTE — Patient Instructions (Signed)
-   Increase free water intake, continue cranberry juice - Continue phentermine 15 mg daily - Advance lifestyle (diet and exercise) - Obtain labs with orders provided, will reach out with results once available - Return for follow-up in 2 months

## 2022-08-09 NOTE — Assessment & Plan Note (Signed)
Patient is noted strong urine odor on and off for months, primarily after discontinuation of prednisone, is controlled with regular cranberry juice intake.  If she does not take cranberry juice, worsened odor.  Denies any overt dysuria, no flank pain, no discharge, no lesions.  Plan for urinalysis, culture, self swab for additional infectious etiology.  Over the interim, encouraged hydration and continued regimen.

## 2022-08-09 NOTE — Assessment & Plan Note (Signed)
Patient was started on phentermine 15 mg, has demonstrated interval weight loss, tolerating medication well without adverse effects reported.  Specifically denies any palpitations, chest pain, no worsening reflux, no GI symptoms including nausea, bowel change, stomach pain, no sleep-related issues.  At this stage we discussed further titration versus continuing current regimen.  From medication management standpoint she has elected to continue current regimen but low threshold to increase if plateau and weight loss noted.  I have encouraged continued healthy lifestyle changes to optimize weight management.

## 2022-08-09 NOTE — Assessment & Plan Note (Signed)
Stable with no new/worsening symptoms after initiation of phentermine 50 mg.  Continue to monitor this issue.

## 2022-08-09 NOTE — Progress Notes (Signed)
     Primary Care / Sports Medicine Office Visit  Patient Information:  Patient ID: Brandy Vasquez, female DOB: [birth year] Age: 46 y.o. MRN: [id number]   Brandy Vasquez is a pleasant 46 y.o. female presenting with the following:  Chief Complaint  Patient presents with   Weight Check    Tolerating medication well.     Vitals:   08/09/22 0831  BP: 120/80  Pulse: 76  SpO2: 98%   Vitals:   08/09/22 0831  Weight: 191 lb 12.8 oz (87 kg)  Height: 5\' 6"  (1.676 m)   Body mass index is 30.96 kg/m.  No results found.   Independent interpretation of notes and tests performed by another provider:   None  Procedures performed:   None  Pertinent History, Exam, Impression, and Recommendations:   Problem List Items Addressed This Visit       Cardiovascular and Mediastinum   Primary hypertension    Stable readings, no cardiopulmonary symptoms.  Tolerating phentermine 15 mg well.        Digestive   Gastroesophageal reflux disease without esophagitis    Stable with no new/worsening symptoms after initiation of phentermine 50 mg.  Continue to monitor this issue.        Other   Class 1 obesity with serious comorbidity and body mass index (BMI) of 31.0 to 31.9 in adult   Relevant Medications   phentermine 15 MG capsule   Malodorous urine - Primary    Patient is noted strong urine odor on and off for months, primarily after discontinuation of prednisone, is controlled with regular cranberry juice intake.  If she does not take cranberry juice, worsened odor.  Denies any overt dysuria, no flank pain, no discharge, no lesions.  Plan for urinalysis, culture, self swab for additional infectious etiology.  Over the interim, encouraged hydration and continued regimen.      Relevant Orders   Urinalysis   Urine Culture   Cervicovaginal ancillary only   Encounter for weight management    Patient was started on phentermine 15 mg, has demonstrated interval weight loss, tolerating medication  well without adverse effects reported.  Specifically denies any palpitations, chest pain, no worsening reflux, no GI symptoms including nausea, bowel change, stomach pain, no sleep-related issues.  At this stage we discussed further titration versus continuing current regimen.  From medication management standpoint she has elected to continue current regimen but low threshold to increase if plateau and weight loss noted.  I have encouraged continued healthy lifestyle changes to optimize weight management.        Orders & Medications Meds ordered this encounter  Medications   phentermine 15 MG capsule    Sig: Take 1 capsule (15 mg total) by mouth daily before breakfast.    Dispense:  60 capsule    Refill:  0   Orders Placed This Encounter  Procedures   Urine Culture   Urinalysis     Return in about 2 months (around 10/09/2022).     Montel Culver, MD   Primary Care Sports Medicine [place]

## 2022-08-10 ENCOUNTER — Encounter: Payer: Self-pay | Admitting: Family Medicine

## 2022-08-10 ENCOUNTER — Other Ambulatory Visit: Payer: Self-pay | Admitting: Family Medicine

## 2022-08-10 LAB — CERVICOVAGINAL ANCILLARY ONLY
Bacterial Vaginitis (gardnerella): POSITIVE — AB
Candida Glabrata: NEGATIVE
Candida Vaginitis: POSITIVE — AB
Chlamydia: NEGATIVE
Comment: NEGATIVE
Comment: NEGATIVE
Comment: NEGATIVE
Comment: NEGATIVE
Comment: NEGATIVE
Comment: NORMAL
Neisseria Gonorrhea: NEGATIVE
Trichomonas: NEGATIVE

## 2022-08-10 LAB — URINALYSIS
Bilirubin, UA: NEGATIVE
Glucose, UA: NEGATIVE
Ketones, UA: NEGATIVE
Nitrite, UA: NEGATIVE
Specific Gravity, UA: 1.029 (ref 1.005–1.030)
Urobilinogen, Ur: 1 mg/dL (ref 0.2–1.0)
pH, UA: 6.5 (ref 5.0–7.5)

## 2022-08-10 MED ORDER — METRONIDAZOLE 500 MG PO TABS: 500.0000 mg | ORAL_TABLET | Freq: Two times a day (BID) | ORAL | 0 refills | Status: AC

## 2022-08-10 MED ORDER — NITROFURANTOIN MONOHYD MACRO 100 MG PO CAPS: 100.0000 mg | ORAL_CAPSULE | Freq: Two times a day (BID) | ORAL | 0 refills | Status: DC

## 2022-08-10 MED ORDER — FLUCONAZOLE 150 MG PO TABS: 150.0000 mg | ORAL_TABLET | Freq: Once | ORAL | 0 refills | Status: AC

## 2022-08-11 LAB — URINE CULTURE

## 2022-08-17 ENCOUNTER — Ambulatory Visit: Payer: BC Managed Care – PPO | Admitting: Internal Medicine

## 2022-08-17 ENCOUNTER — Encounter: Payer: Self-pay | Admitting: Family Medicine

## 2022-08-17 ENCOUNTER — Other Ambulatory Visit: Payer: Self-pay

## 2022-08-17 DIAGNOSIS — Z0000 Encounter for general adult medical examination without abnormal findings: Secondary | ICD-10-CM

## 2022-09-08 DIAGNOSIS — M0579 Rheumatoid arthritis with rheumatoid factor of multiple sites without organ or systems involvement: Secondary | ICD-10-CM | POA: Diagnosis not present

## 2022-09-08 DIAGNOSIS — M7062 Trochanteric bursitis, left hip: Secondary | ICD-10-CM | POA: Diagnosis not present

## 2022-09-30 ENCOUNTER — Encounter: Payer: Self-pay | Admitting: Obstetrics and Gynecology

## 2022-09-30 ENCOUNTER — Ambulatory Visit (INDEPENDENT_AMBULATORY_CARE_PROVIDER_SITE_OTHER): Payer: BC Managed Care – PPO | Admitting: Obstetrics and Gynecology

## 2022-09-30 ENCOUNTER — Other Ambulatory Visit (HOSPITAL_COMMUNITY)
Admission: RE | Admit: 2022-09-30 | Discharge: 2022-09-30 | Disposition: A | Discharge status: Home / Self Care | Payer: BC Managed Care – PPO | Source: Ambulatory Visit | Attending: Obstetrics and Gynecology | Admitting: Obstetrics and Gynecology

## 2022-09-30 VITALS — BP 137/98 | HR 77 | Ht 66.0 in | Wt 187.0 lb

## 2022-09-30 DIAGNOSIS — Z124 Encounter for screening for malignant neoplasm of cervix: Secondary | ICD-10-CM

## 2022-09-30 DIAGNOSIS — Z7689 Persons encountering health services in other specified circumstances: Secondary | ICD-10-CM

## 2022-09-30 DIAGNOSIS — Z01419 Encounter for gynecological examination (general) (routine) without abnormal findings: Secondary | ICD-10-CM

## 2022-09-30 DIAGNOSIS — Z1231 Encounter for screening mammogram for malignant neoplasm of breast: Secondary | ICD-10-CM

## 2022-09-30 NOTE — Progress Notes (Signed)
Patients presents for annual exam today. Patient states she has had a lot of UTI's over the past year. She has not seen urologist yet. She does not feel like she has one now. She has been treated with Macrobid mostly. Last UTI was in November. [facility] referred her for Annual and recurrent UTI's. Patient is due for pap smear, ordered. She states she had one this year, but it was in [facility] (where she used to live). Patient is due for mammogram, ordered. Patients annual labs are ordered. She declines Flu vaccine today. She currently has a Mirena IUD and just recently had it placed in [employer]. Patient states no other questions or concerns at this time.

## 2022-09-30 NOTE — Progress Notes (Signed)
HPI:      [name] is a 46 y.o. [id number] who LMP was Patient's last menstrual period was 09/24/2022 (exact date).  Subjective:   She presents today for her annual examination.  She states that she has had 3 UTIs in the last 2 months.  She says that they have been treated appropriately and they resolve but then seem to come back.  She has usually been treated with Macrobid.  Last time she was given both Macrobid and Flagyl.  Of significant note she states she is allergic to Bactrim.  She reports no new activities or things that could contribute to UTI that she knows of.   She has a remote history of LEEP, but says her Pap smears have been normal since that time. (20 years ago) Mirena in place for birth control    Hx: The following portions of the patient's history were reviewed and updated as appropriate:             She  has a past medical history of GERD (gastroesophageal reflux disease), Herpes, and Hypertension. She does not have any pertinent problems on file. She  has a past surgical history that includes Breast biopsy (Left, 2016). Her family history includes ADD / ADHD in her daughter and daughter; Alzheimer's disease in her maternal grandmother; Cancer in her father; Diabetes in her mother; Hypertension in her mother; Ovarian cancer in her paternal grandmother; Rheum arthritis in her mother; Stroke in her maternal grandfather. She  reports that she has never smoked. She has never used smokeless tobacco. She reports current alcohol use of about 2.0 standard drinks of alcohol per week. She reports that she does not use drugs. She has a current medication list which includes the following prescription(s): folic acid, gabapentin, levonorgestrel, methotrexate, phentermine, and remicade. She is allergic to bactrim [sulfamethoxazole-trimethoprim].       Review of Systems:  Review of Systems  Constitutional: Denied constitutional symptoms, night sweats, recent illness, fatigue, fever,  insomnia and weight loss.  Eyes: Denied eye symptoms, eye pain, photophobia, vision change and visual disturbance.  Ears/Nose/Throat/Neck: Denied ear, nose, throat or neck symptoms, hearing loss, nasal discharge, sinus congestion and sore throat.  Cardiovascular: Denied cardiovascular symptoms, arrhythmia, chest pain/pressure, edema, exercise intolerance, orthopnea and palpitations.  Respiratory: Denied pulmonary symptoms, asthma, pleuritic pain, productive sputum, cough, dyspnea and wheezing.  Gastrointestinal: Denied, gastro-esophageal reflux, melena, nausea and vomiting.  Genitourinary: Denied genitourinary symptoms including symptomatic vaginal discharge, pelvic relaxation issues, and urinary complaints.  Musculoskeletal: Denied musculoskeletal symptoms, stiffness, swelling, muscle weakness and myalgia.  Dermatologic: Denied dermatology symptoms, rash and scar.  Neurologic: Denied neurology symptoms, dizziness, headache, neck pain and syncope.  Psychiatric: Denied psychiatric symptoms, anxiety and depression.  Endocrine: Denied endocrine symptoms including hot flashes and night sweats.   Meds:   Current Outpatient Medications on File Prior to Visit  Medication Sig Dispense Refill   folic acid (FOLVITE) 1 MG tablet Take 1 mg by mouth daily.     gabapentin (NEURONTIN) 100 MG capsule Take 100 mg by mouth 2 (two) times daily.     levonorgestrel (MIRENA) 20 MCG/DAY IUD 1 each by Intrauterine route once.     methotrexate (RHEUMATREX) 2.5 MG tablet Take 15 mg by mouth once a week.     phentermine 15 MG capsule Take 1 capsule (15 mg total) by mouth daily before breakfast. 60 capsule 0   REMICADE 100 MG injection Inject into the vein.     No current facility-administered medications on  file prior to visit.     Objective:     Vitals:   09/30/22 1416  BP: (!) 137/98  Pulse: 77    Filed Weights   09/30/22 1416  Weight: 187 lb (84.8 kg)              Physical examination General NAD,  Conversant  HEENT Atraumatic; Op clear with mmm.  Normo-cephalic. Pupils reactive. Anicteric sclerae  Thyroid/Neck Smooth without nodularity or enlargement. Normal ROM.  Neck Supple.  Skin No rashes, lesions or ulceration. Normal palpated skin turgor. No nodularity.  Breasts: No masses or discharge.  Symmetric.  No axillary adenopathy.  Lungs: Clear to auscultation.No rales or wheezes. Normal Respiratory effort, no retractions.  Heart: NSR.  No murmurs or rubs appreciated. No periferal edema  Abdomen: Soft.  Non-tender.  No masses.  No HSM. No hernia  Extremities: Moves all appropriately.  Normal ROM for age. No lymphadenopathy.  Neuro: Oriented to PPT.  Normal mood. Normal affect.     Pelvic:   Vulva: Normal appearance.  No lesions.  Vagina: No lesions or abnormalities noted.  Support: Normal pelvic support.  Urethra No masses tenderness or scarring.  Meatus Normal size without lesions or prolapse.  Cervix: Normal appearance.  No lesions.  Obviously status post procedure  Anus: Normal exam.  No lesions.  Perineum: Normal exam.  No lesions.        Bimanual   Uterus: Normal size.  Non-tender.  Mobile.  AV.  Adnexae: No masses.  Non-tender to palpation.  Cul-de-sac: Negative for abnormality.     Assessment:    [id number] Patient Active Problem List   Diagnosis Date Noted   Malodorous urine 08/09/2022   Encounter for weight management 08/09/2022   Arthralgia of left hip 07/12/2022   Breast pain, left 02/05/2022   Leg cramps 01/19/2022   Class 1 obesity with serious comorbidity and body mass index (BMI) of 31.0 to 31.9 in adult 01/19/2022   Long-term use of immunosuppressant medication 12/24/2021   Rheumatoid arthritis involving multiple sites with positive rheumatoid factor (HCC) 12/24/2021   Injury of triangular fibrocartilage complex of left wrist 12/08/2021   Cyclic citrullinated peptide (CCP) antibody positive 10/29/2021   Cervical spondylosis with radiculopathy 10/07/2021    Gastroesophageal reflux disease without esophagitis 10/07/2021   Furuncle 08/26/2021   Cystitis 08/04/2021   Cervical paraspinal muscle spasm 08/04/2021   Metabolic syndrome 05/27/2021   Rotator cuff arthropathy of left shoulder 05/27/2021   Primary hypertension 05/15/2021   Acute pain of left shoulder 05/15/2021   Annual physical exam 04/03/2021   Fungal infection of skin 04/03/2021   Elevated blood pressure reading 04/03/2021   Dysplasia of cervix, low grade (CIN 1) 08/31/2018     1. Establishing care with new doctor, encounter for   2. Well woman exam with routine gynecological exam   3. Cervical cancer screening   4. Screening mammogram for breast cancer     Patient currently asymptomatic with UTIs.  She has had some recurrences in the last few months but generally does not have many UTIs.  Mirena doing well.   Plan:            1.  Basic Screening Recommendations The basic screening recommendations for asymptomatic women were discussed with the patient during her visit.  The age-appropriate recommendations were discussed with her and the rational for the tests reviewed.  When I am informed by the patient that another primary care physician has previously obtained the age-appropriate tests and  they are up-to-date, only outstanding tests are ordered and referrals given as necessary.  Abnormal results of tests will be discussed with her when all of her results are completed.  Routine preventative health maintenance measures emphasized: Exercise/Diet/Weight control, Tobacco Warnings, Alcohol/Substance use risks and Stress Management Pap Co-test performed -mammogram ordered -will return for fasting blood work 2.  Urine sent for C&S just to be sure she does not have an ongoing infection.  Orders Orders Placed This Encounter  Procedures   MM DIGITAL SCREENING BILATERAL    No orders of the defined types were placed in this encounter.         F/U  No follow-ups on file.  Elonda Husky, M.D. 09/30/2022 3:01 PM

## 2022-10-02 LAB — URINE CULTURE

## 2022-10-05 LAB — CYTOLOGY - PAP
Comment: NEGATIVE
High risk HPV: POSITIVE — AB

## 2022-10-06 ENCOUNTER — Other Ambulatory Visit: Payer: BC Managed Care – PPO

## 2022-10-06 DIAGNOSIS — Z7689 Persons encountering health services in other specified circumstances: Secondary | ICD-10-CM | POA: Diagnosis not present

## 2022-10-06 DIAGNOSIS — Z01419 Encounter for gynecological examination (general) (routine) without abnormal findings: Secondary | ICD-10-CM

## 2022-10-07 LAB — HEMOGLOBIN A1C
Est. average glucose Bld gHb Est-mCnc: 88 mg/dL
Hgb A1c MFr Bld: 4.7 % — ABNORMAL LOW (ref 4.8–5.6)

## 2022-10-12 ENCOUNTER — Ambulatory Visit (INDEPENDENT_AMBULATORY_CARE_PROVIDER_SITE_OTHER): Payer: BC Managed Care – PPO | Admitting: Family Medicine

## 2022-10-12 ENCOUNTER — Encounter: Payer: Self-pay | Admitting: Family Medicine

## 2022-10-12 VITALS — BP 128/78 | HR 76 | Ht 66.0 in | Wt 187.0 lb

## 2022-10-12 DIAGNOSIS — Z7689 Persons encountering health services in other specified circumstances: Secondary | ICD-10-CM | POA: Diagnosis not present

## 2022-10-12 DIAGNOSIS — K5904 Chronic idiopathic constipation: Secondary | ICD-10-CM | POA: Diagnosis not present

## 2022-10-12 MED ORDER — PHENTERMINE HCL 30 MG PO CAPS: 30.0000 mg | ORAL_CAPSULE | ORAL | 0 refills | Status: DC

## 2022-10-12 NOTE — Assessment & Plan Note (Signed)
Chronic issue, stable despite phentermine 15 mg.  Given planned titration, did advise patient to monitor symptoms for any worsening, treatment options offered.

## 2022-10-12 NOTE — Assessment & Plan Note (Signed)
Patient has demonstrated steady interim weight loss on phentermine 15 mg, denies any palpitations or cardiac complaints otherwise, no insomnia, has baseline constipation that remains unchanged.  Plan to titrate to phentermine 30 mg, discussed monitoring for any new/worsening symptoms, will contact us in 1 month for refills/dose modification, return in 2 months in clinic.

## 2022-10-12 NOTE — Progress Notes (Signed)
     Primary Care / Sports Medicine Office Visit  Patient Information:  Patient ID: Brandy Vasquez, female DOB: [birth year] Age: 46 y.o. MRN: [id number]   Brandy Vasquez is a pleasant 46 y.o. female presenting with the following:  Chief Complaint  Patient presents with   Weight Check    Tolerating medication well, no issues    Vitals:   10/12/22 0841  BP: 128/78  Pulse: 76  SpO2: 99%   Vitals:   10/12/22 0841  Weight: 187 lb (84.8 kg)  Height: 5\' 6"  (1.676 m)   Body mass index is 30.18 kg/m.  No results found.   Independent interpretation of notes and tests performed by another provider:   None  Procedures performed:   None  Pertinent History, Exam, Impression, and Recommendations:   Problem List Items Addressed This Visit       Digestive   Chronic idiopathic constipation    Chronic issue, stable despite phentermine 15 mg.  Given planned titration, did advise patient to monitor symptoms for any worsening, treatment options offered.        Other   Encounter for weight management - Primary    Patient has demonstrated steady interim weight loss on phentermine 15 mg, denies any palpitations or cardiac complaints otherwise, no insomnia, has baseline constipation that remains unchanged.  Plan to titrate to phentermine 30 mg, discussed monitoring for any new/worsening symptoms, will contact in 1 month for refills/dose modification, return in 2 months in clinic.      Relevant Medications   phentermine 30 MG capsule     Orders & Medications Meds ordered this encounter  Medications   phentermine 30 MG capsule    Sig: Take 1 capsule (30 mg total) by mouth every morning.    Dispense:  30 capsule    Refill:  0   No orders of the defined types were placed in this encounter.    Return in about 2 months (around 12/13/2022) for weight check.     12/15/2022, MD, [facility]   Primary Care Sports Medicine [facility] at [facility]

## 2022-10-12 NOTE — Patient Instructions (Addendum)
-   Take new dose of phentermine 30 mg - Continue healthy lifestyle changes with diet and exercise - If constipation worsens, increase water and fiber intake, use probiotics - Contact our office in 1 month for refills - Follow-up for weight check in 2 months

## 2022-10-13 DIAGNOSIS — M0579 Rheumatoid arthritis with rheumatoid factor of multiple sites without organ or systems involvement: Secondary | ICD-10-CM | POA: Diagnosis not present

## 2022-10-13 DIAGNOSIS — Z7960 Long term (current) use of unspecified immunomodulators and immunosuppressants: Secondary | ICD-10-CM | POA: Diagnosis not present

## 2022-11-03 DIAGNOSIS — M0579 Rheumatoid arthritis with rheumatoid factor of multiple sites without organ or systems involvement: Secondary | ICD-10-CM | POA: Diagnosis not present

## 2022-11-15 ENCOUNTER — Encounter: Payer: Self-pay | Admitting: Obstetrics & Gynecology

## 2022-11-15 ENCOUNTER — Other Ambulatory Visit (HOSPITAL_COMMUNITY)
Admission: RE | Admit: 2022-11-15 | Discharge: 2022-11-15 | Disposition: A | Discharge status: Home / Self Care | Payer: BC Managed Care – PPO | Source: Ambulatory Visit | Attending: Obstetrics & Gynecology | Admitting: Obstetrics & Gynecology

## 2022-11-15 ENCOUNTER — Ambulatory Visit (INDEPENDENT_AMBULATORY_CARE_PROVIDER_SITE_OTHER): Payer: BC Managed Care – PPO | Admitting: Obstetrics & Gynecology

## 2022-11-15 VITALS — BP 120/60 | Ht 66.0 in | Wt 185.0 lb

## 2022-11-15 DIAGNOSIS — N72 Inflammatory disease of cervix uteri: Secondary | ICD-10-CM | POA: Diagnosis not present

## 2022-11-15 DIAGNOSIS — B977 Papillomavirus as the cause of diseases classified elsewhere: Secondary | ICD-10-CM | POA: Diagnosis not present

## 2022-11-15 DIAGNOSIS — R87612 Low grade squamous intraepithelial lesion on cytologic smear of cervix (LGSIL): Secondary | ICD-10-CM | POA: Insufficient documentation

## 2022-11-15 DIAGNOSIS — N870 Mild cervical dysplasia: Secondary | ICD-10-CM | POA: Diagnosis not present

## 2022-11-15 NOTE — Progress Notes (Signed)
   Established Patient Office Visit  Subjective   Patient ID: Shirlena Brinegar, female    DOB: June 20, 1976  Age: 47 y.o. MRN: [id number]  Chief Complaint  Patient presents with   Colposcopy    HPI   47 yo P2 here for a colpo due to LGSIL pap. She has a h/o LEEP 25 years ago and 2 colposcopies since then.  She uses Mirena for contraception and is happy with this method.  Objective:     BP 120/60   Ht 5\' 6"  (1.676 m)   Wt 185 lb (83.9 kg)   LMP 11/15/2022 (Exact Date)   BMI 29.86 kg/m    Physical Exam   Well nourished, well hydrated Black female, no apparent distress She is ambulating and conversing normally. UPT negative, consent signed, time out done Cervix prepped with acetic acid. Mirena strings noted, about 1.5 cm long The cervix is mal shapen c/w h/o NSVD x 2 and LEEP  Transformation zone seen in its entirety. Colpo adequate. Small area of acetowhite changes (c/w CIN 1) seen on the anterior lip of the cervix ECC obtained. She tolerated the procedure well.  Assessment & Plan:   Problem List Items Addressed This Visit   None Visit Diagnoses     LGSIL on Pap smear of cervix    -  Primary      If the ECC is benign, then she will get a repeat pap in a year. If not, then she is aware that she will need either a LEEP or CKC. Probably a LEEP would be extremely difficult given the shape of her cervix.   Emily Filbert, MD

## 2022-11-17 LAB — SURGICAL PATHOLOGY

## 2022-11-18 ENCOUNTER — Encounter: Payer: Self-pay | Admitting: Obstetrics & Gynecology

## 2022-11-25 ENCOUNTER — Encounter: Payer: Self-pay | Admitting: Obstetrics and Gynecology

## 2022-11-29 ENCOUNTER — Encounter: Payer: Self-pay | Admitting: Obstetrics & Gynecology

## 2022-12-13 ENCOUNTER — Encounter: Payer: Self-pay | Admitting: Family Medicine

## 2022-12-13 ENCOUNTER — Ambulatory Visit (INDEPENDENT_AMBULATORY_CARE_PROVIDER_SITE_OTHER): Payer: BC Managed Care – PPO | Admitting: Family Medicine

## 2022-12-13 VITALS — BP 118/80 | HR 84 | Ht 66.0 in | Wt 187.0 lb

## 2022-12-13 DIAGNOSIS — Z7689 Persons encountering health services in other specified circumstances: Secondary | ICD-10-CM

## 2022-12-13 DIAGNOSIS — M0579 Rheumatoid arthritis with rheumatoid factor of multiple sites without organ or systems involvement: Secondary | ICD-10-CM | POA: Diagnosis not present

## 2022-12-13 MED ORDER — BUPROPION HCL ER (XL) 150 MG PO TB24: 150.0000 mg | ORAL_TABLET | Freq: Every day | ORAL | 0 refills | Status: DC

## 2022-12-13 MED ORDER — PHENTERMINE HCL 37.5 MG PO CAPS: 37.5000 mg | ORAL_CAPSULE | ORAL | 0 refills | Status: DC

## 2022-12-13 NOTE — Patient Instructions (Signed)
-   Take new dose of phentermine daily - Start bupropion daily - Continue healthy lifestyle changes - MyChart video visit in 1 month - Contact our office for any questions/concerns between now and then

## 2022-12-13 NOTE — Progress Notes (Signed)
     Primary Care / Sports Medicine Office Visit  Patient Information:  Patient ID: Brandy Vasquez, female DOB: 03-09-1976 Age: 47 y.o. MRN: [id number]   Brandy Vasquez is a pleasant 47 y.o. female presenting with the following:  Chief Complaint  Patient presents with   Weight Check    Vitals:   12/13/22 0848  BP: 118/80  Pulse: 84  SpO2: 98%   Vitals:   12/13/22 0848  Weight: 187 lb (84.8 kg)  Height: 5' 6"$  (1.676 m)   Body mass index is 30.18 kg/m.  No results found.   Independent interpretation of notes and tests performed by another provider:   None  Procedures performed:   None  Pertinent History, Exam, Impression, and Recommendations:   Steven was seen today for weight check.  Encounter for weight management Assessment & Plan: Chronic, without adequate full control.  Patient did not get an opportunity to contact us last month for refill of phentermine, despite this has managed to have overall stable weight.  We discussed various treatment strategies and we will further titrate phentermine to 37.5 mg daily, adjunct bupropion 150 mg 24-hour formulation added, lifestyle modifications encouraged.  Will have a video visit in 1 month to assess medication tolerance and determine the need for further titration.  Orders: -     buPROPion HCl ER (XL); Take 1 tablet (150 mg total) by mouth daily.  Dispense: 30 tablet; Refill: 0 -     Phentermine HCl; Take 1 capsule (37.5 mg total) by mouth every morning.  Dispense: 30 capsule; Refill: 0  Rheumatoid arthritis involving multiple sites with positive rheumatoid factor ([place]) Assessment & Plan: Has started infusions and noting overall symptom control, was able to discontinue prednisone.  Will continue to follow peripherally on this issue.      Orders & Medications Meds ordered this encounter  Medications   buPROPion (WELLBUTRIN XL) 150 MG 24 hr tablet    Sig: Take 1 tablet (150 mg total) by mouth daily.    Dispense:  30  tablet    Refill:  0   phentermine 37.5 MG capsule    Sig: Take 1 capsule (37.5 mg total) by mouth every morning.    Dispense:  30 capsule    Refill:  0   No orders of the defined types were placed in this encounter.    Return in about 4 weeks (around 01/10/2023) for [place] Virtual Visit.     Montel Culver, MD, [facility]   Primary Care Sports Medicine Primary Care and Sports Medicine at [facility]

## 2022-12-13 NOTE — Assessment & Plan Note (Signed)
Has started infusions and noting overall symptom control, was able to discontinue prednisone.  Will continue to follow peripherally on this issue.

## 2022-12-13 NOTE — Assessment & Plan Note (Signed)
Chronic, without adequate full control.  Patient did not get an opportunity to contact us last month for refill of phentermine, despite this has managed to have overall stable weight.  We discussed various treatment strategies and we will further titrate phentermine to 37.5 mg daily, adjunct bupropion 150 mg 24-hour formulation added, lifestyle modifications encouraged.  Will have a video visit in 1 month to assess medication tolerance and determine the need for further titration.

## 2022-12-22 ENCOUNTER — Encounter: Payer: Self-pay | Admitting: Obstetrics and Gynecology

## 2022-12-22 ENCOUNTER — Ambulatory Visit (INDEPENDENT_AMBULATORY_CARE_PROVIDER_SITE_OTHER): Payer: BC Managed Care – PPO | Admitting: Obstetrics and Gynecology

## 2022-12-22 VITALS — BP 125/85 | HR 73 | Ht 66.0 in | Wt 186.0 lb

## 2022-12-22 DIAGNOSIS — N870 Mild cervical dysplasia: Secondary | ICD-10-CM

## 2022-12-22 NOTE — Progress Notes (Signed)
HPI:      [name] is a 47 y.o. [id number] who LMP was No LMP recorded. (Menstrual status: IUD).  Subjective:   She presents today after having a colposcopy with a positive ECC showing CIN-1.  Patient has a remote history of CIN and treatment.  Her Paps have been normal until approximately 2 months ago. She has childbearing. She has a Mirena IUD in place.  She would like to keep it. Apparently by colposcopy her cervical exam is quite difficult.  Likely secondary to previous excisional biopsy.    Hx: The following portions of the patient's history were reviewed and updated as appropriate:             She  has a past medical history of GERD (gastroesophageal reflux disease), Herpes, and Hypertension. She does not have any pertinent problems on file. She  has a past surgical history that includes Breast biopsy (Left, 2016). Her family history includes ADD / ADHD in her daughter and daughter; Alzheimer's disease in her maternal grandmother; Cancer in her father; Diabetes in her mother; Hypertension in her mother; Ovarian cancer in her paternal grandmother; Rheum arthritis in her mother; Stroke in her maternal grandfather. She  reports that she has never smoked. She has never used smokeless tobacco. She reports current alcohol use of about 2.0 standard drinks of alcohol per week. She reports that she does not use drugs. She has a current medication list which includes the following prescription(s): bupropion, folic acid, gabapentin, levonorgestrel, methotrexate, phentermine, and remicade. She is allergic to bactrim [sulfamethoxazole-trimethoprim].       Review of Systems:  Review of Systems  Constitutional: Denied constitutional symptoms, night sweats, recent illness, fatigue, fever, insomnia and weight loss.  Eyes: Denied eye symptoms, eye pain, photophobia, vision change and visual disturbance.  Ears/Nose/Throat/Neck: Denied ear, nose, throat or neck symptoms, hearing loss, nasal  discharge, sinus congestion and sore throat.  Cardiovascular: Denied cardiovascular symptoms, arrhythmia, chest pain/pressure, edema, exercise intolerance, orthopnea and palpitations.  Respiratory: Denied pulmonary symptoms, asthma, pleuritic pain, productive sputum, cough, dyspnea and wheezing.  Gastrointestinal: Denied, gastro-esophageal reflux, melena, nausea and vomiting.  Genitourinary: See HPI for additional information.  Musculoskeletal: Denied musculoskeletal symptoms, stiffness, swelling, muscle weakness and myalgia.  Dermatologic: Denied dermatology symptoms, rash and scar.  Neurologic: Denied neurology symptoms, dizziness, headache, neck pain and syncope.  Psychiatric: Denied psychiatric symptoms, anxiety and depression.  Endocrine: Denied endocrine symptoms including hot flashes and night sweats.   Meds:   Current Outpatient Medications on File Prior to Visit  Medication Sig Dispense Refill   buPROPion (WELLBUTRIN XL) 150 MG 24 hr tablet Take 1 tablet (150 mg total) by mouth daily. 30 tablet 0   folic acid (FOLVITE) 1 MG tablet Take 1 mg by mouth daily.     gabapentin (NEURONTIN) 100 MG capsule Take 100 mg by mouth 2 (two) times daily.     levonorgestrel (MIRENA) 20 MCG/DAY IUD 1 each by Intrauterine route once.     methotrexate (RHEUMATREX) 2.5 MG tablet Take 15 mg by mouth once a week.     phentermine 37.5 MG capsule Take 1 capsule (37.5 mg total) by mouth every morning. 30 capsule 0   REMICADE 100 MG injection Inject into the vein.     No current facility-administered medications on file prior to visit.      Objective:     Vitals:   12/22/22 1422  BP: 125/85  Pulse: 73   Filed Weights   12/22/22 1422  Weight:  186 lb (84.4 kg)                        Assessment:    [id number] Patient Active Problem List   Diagnosis Date Noted   Chronic idiopathic constipation 10/12/2022   Malodorous urine 08/09/2022   Encounter for weight management 08/09/2022    Arthralgia of left hip 07/12/2022   Breast pain, left 02/05/2022   Leg cramps 01/19/2022   Class 1 obesity with serious comorbidity and body mass index (BMI) of 31.0 to 31.9 in adult 01/19/2022   Long-term use of immunosuppressant medication 12/24/2021   Rheumatoid arthritis involving multiple sites with positive rheumatoid factor ([place]) 12/24/2021   Injury of triangular fibrocartilage complex of left wrist 123456   Cyclic citrullinated peptide (CCP) antibody positive 10/29/2021   Cervical spondylosis with radiculopathy 10/07/2021   Gastroesophageal reflux disease without esophagitis 10/07/2021   Furuncle 08/26/2021   Cystitis 08/04/2021   Cervical paraspinal muscle spasm [id number]   Metabolic syndrome 123XX123   Rotator cuff arthropathy of left shoulder 05/27/2021   Primary hypertension 05/15/2021   Annual physical exam 04/03/2021   Fungal infection of skin 04/03/2021   Elevated blood pressure reading 04/03/2021   Dysplasia of cervix, low grade (CIN 1) 08/31/2018     1. CIN I (cervical intraepithelial neoplasia I)     By [facility]   Plan:            1.  I have given the patient 2 choices.  She has chosen to have a repeat excisional biopsy/LEEP/cold cone in the OR.  This will give [place] the best chance to excise the lesion in the canal despite her difficult cervical anatomy.  My plan will be to try to preserve her IUD at the time.  If this is not possible I discussed removing the IUD and replacing it at the end of the case.  She is amenable to this. She will return for preop appointment Orders No orders of the defined types were placed in this encounter.   No orders of the defined types were placed in this encounter.     F/U  Return in about 2 weeks (around 01/05/2023). I spent 22 minutes involved in the care of this patient preparing to see the patient by obtaining and reviewing her medical history (including labs, imaging tests and prior procedures), documenting clinical  information in the electronic health record (EHR), counseling and coordinating care plans, writing and sending prescriptions, ordering tests or procedures and in direct communicating with the patient and medical staff discussing pertinent items from her history and physical exam.  Finis Bud, M.D. 12/22/2022 3:04 PM

## 2022-12-22 NOTE — Progress Notes (Signed)
Patient presents today to discuss next steps related to CIN 1. She states no additional concerns today.

## 2022-12-29 DIAGNOSIS — M0579 Rheumatoid arthritis with rheumatoid factor of multiple sites without organ or systems involvement: Secondary | ICD-10-CM | POA: Diagnosis not present

## 2022-12-30 ENCOUNTER — Encounter: Payer: Self-pay | Admitting: Family Medicine

## 2022-12-31 ENCOUNTER — Other Ambulatory Visit: Payer: Self-pay

## 2022-12-31 ENCOUNTER — Encounter: Payer: Self-pay | Admitting: Family Medicine

## 2022-12-31 DIAGNOSIS — Z1211 Encounter for screening for malignant neoplasm of colon: Secondary | ICD-10-CM

## 2022-12-31 NOTE — Telephone Encounter (Signed)
Please advise 

## 2023-01-04 ENCOUNTER — Other Ambulatory Visit: Payer: Self-pay | Admitting: Family Medicine

## 2023-01-04 DIAGNOSIS — Z7689 Persons encountering health services in other specified circumstances: Secondary | ICD-10-CM

## 2023-01-05 ENCOUNTER — Encounter: Payer: Self-pay | Admitting: Obstetrics and Gynecology

## 2023-01-05 ENCOUNTER — Ambulatory Visit (INDEPENDENT_AMBULATORY_CARE_PROVIDER_SITE_OTHER): Payer: BC Managed Care – PPO | Admitting: Obstetrics and Gynecology

## 2023-01-05 VITALS — BP 147/95 | HR 76 | Ht 66.0 in | Wt 187.2 lb

## 2023-01-05 DIAGNOSIS — N870 Mild cervical dysplasia: Secondary | ICD-10-CM | POA: Diagnosis not present

## 2023-01-05 NOTE — H&P (Signed)
PRE-OPERATIVE HISTORY AND PHYSICAL EXAM  PCP:  Montel Culver, MD Subjective:   HPI:  Brandy Vasquez is a 47 y.o. [id number].  No LMP recorded. (Menstrual status: IUD).  She presents today for a pre-op discussion and PE.  She has the following symptoms: Inadequate colposcopy-very difficult exam-CIN-1 by endocervical curettage.  This is a recurrence  Patient has completed childbearing  Review of Systems:   Constitutional: Denied constitutional symptoms, night sweats, recent illness, fatigue, fever, insomnia and weight loss.  Eyes: Denied eye symptoms, eye pain, photophobia, vision change and visual disturbance.  Ears/Nose/Throat/Neck: Denied ear, nose, throat or neck symptoms, hearing loss, nasal discharge, sinus congestion and sore throat.  Cardiovascular: Denied cardiovascular symptoms, arrhythmia, chest pain/pressure, edema, exercise intolerance, orthopnea and palpitations.  Respiratory: Denied pulmonary symptoms, asthma, pleuritic pain, productive sputum, cough, dyspnea and wheezing.  Gastrointestinal: Denied, gastro-esophageal reflux, melena, nausea and vomiting.  Genitourinary: See HPI for additional information.  Musculoskeletal: Denied musculoskeletal symptoms, stiffness, swelling, muscle weakness and myalgia.  Dermatologic: Denied dermatology symptoms, rash and scar.  Neurologic: Denied neurology symptoms, dizziness, headache, neck pain and syncope.  Psychiatric: Denied psychiatric symptoms, anxiety and depression.  Endocrine: Denied endocrine symptoms including hot flashes and night sweats.   OB History  Gravida Para Term Preterm AB Living  '3 2 2   1 2  '$ SAB IAB Ectopic Multiple Live Births  1       2    # Outcome Date GA Lbr Len/2nd Weight Sex Delivery Anes PTL Lv  3 SAB           2 Term           1 Term             Past Medical History:  Diagnosis Date   GERD (gastroesophageal reflux disease)    Herpes    Hypertension     Past Surgical History:  Procedure  Laterality Date   BREAST BIOPSY Left 2016      SOCIAL HISTORY:  Social History   Tobacco Use  Smoking Status Never  Smokeless Tobacco Never   Social History   Substance and Sexual Activity  Alcohol Use Yes   Alcohol/week: 2.0 standard drinks of alcohol   Types: 2 Standard drinks or equivalent per week    Social History   Substance and Sexual Activity  Drug Use Never    Family History  Problem Relation Age of Onset   Hypertension Mother    Diabetes Mother    Rheum arthritis Mother    Cancer Father    ADD / ADHD Daughter    ADD / ADHD Daughter    Alzheimer's disease Maternal Grandmother    Stroke Maternal Grandfather    Ovarian cancer Paternal Grandmother     ALLERGIES:  Bactrim [sulfamethoxazole-trimethoprim]  MEDS:   Current Outpatient Medications on File Prior to Visit  Medication Sig Dispense Refill   buPROPion (WELLBUTRIN XL) 150 MG 24 hr tablet Take 1 tablet (150 mg total) by mouth daily. 30 tablet 0   folic acid (FOLVITE) 1 MG tablet Take 1 mg by mouth daily.     gabapentin (NEURONTIN) 100 MG capsule Take 100 mg by mouth 2 (two) times daily.     levonorgestrel (MIRENA) 20 MCG/DAY IUD 1 each by Intrauterine route once.     methotrexate (RHEUMATREX) 2.5 MG tablet Take 15 mg by mouth once a week.     phentermine 37.5 MG capsule Take 1 capsule (37.5 mg total) by  mouth every morning. 30 capsule 0   REMICADE 100 MG injection Inject into the vein.     No current facility-administered medications on file prior to visit.    No orders of the defined types were placed in this encounter.    Physical examination There were no vitals taken for this visit.  General NAD, Conversant  HEENT Atraumatic; Op clear with mmm.  Normo-cephalic. Pupils reactive. Anicteric sclerae  Thyroid/Neck Smooth without nodularity or enlargement. Normal ROM.  Neck Supple.  Skin No rashes, lesions or ulceration. Normal palpated skin turgor. No nodularity.  Breasts: No masses or  discharge.  Symmetric.  No axillary adenopathy.  Lungs: Clear to auscultation.No rales or wheezes. Normal Respiratory effort, no retractions.  Heart: NSR.  No murmurs or rubs appreciated. No peripheral edema  Abdomen: Soft.  Non-tender.  No masses.  No HSM. No hernia  Extremities: Moves all appropriately.  Normal ROM for age. No lymphadenopathy.  Neuro: Oriented to PPT.  Normal mood. Normal affect.     Pelvic: Deferred to the OR  -see previous exam and note difficulties.   Assessment:   [id number] Patient Active Problem List   Diagnosis Date Noted   Chronic idiopathic constipation 10/12/2022   Malodorous urine 08/09/2022   Encounter for weight management 08/09/2022   Arthralgia of left hip 07/12/2022   Breast pain, left 02/05/2022   Leg cramps 01/19/2022   Class 1 obesity with serious comorbidity and body mass index (BMI) of 31.0 to 31.9 in adult 01/19/2022   Long-term use of immunosuppressant medication 12/24/2021   Rheumatoid arthritis involving multiple sites with positive rheumatoid factor ([place]) 12/24/2021   Injury of triangular fibrocartilage complex of left wrist 123456   Cyclic citrullinated peptide (CCP) antibody positive 10/29/2021   Cervical spondylosis with radiculopathy 10/07/2021   Gastroesophageal reflux disease without esophagitis 10/07/2021   Furuncle 08/26/2021   Cystitis 08/04/2021   Cervical paraspinal muscle spasm [id number]   Metabolic syndrome 123XX123   Rotator cuff arthropathy of left shoulder 05/27/2021   Primary hypertension 05/15/2021   Annual physical exam 04/03/2021   Fungal infection of skin 04/03/2021   Elevated blood pressure reading 04/03/2021   Dysplasia of cervix, low grade (CIN 1) 08/31/2018    1. CIN I (cervical intraepithelial neoplasia I)     The CIN-1 is endocervical   Very difficult exam.  Cervix obviously status post previous procedure (LEEP)   Plan:   Orders: No orders of the defined types were placed in this encounter.     1.  LEEP conization 2.  Removal and replacement of IUD  Finis Bud, M.D. 01/05/2023 2:38 PM

## 2023-01-05 NOTE — Progress Notes (Signed)
PRE-OPERATIVE HISTORY AND PHYSICAL EXAM  PCP:  Montel Culver, MD Subjective:   HPI:  Brandy Vasquez is a 47 y.o. [id number].  No LMP recorded. (Menstrual status: IUD).  She presents today for a pre-op discussion and PE.  She has the following symptoms: Inadequate colposcopy-very difficult exam-CIN-1 by endocervical curettage.  This is a recurrence  Patient has completed childbearing  Review of Systems:   Constitutional: Denied constitutional symptoms, night sweats, recent illness, fatigue, fever, insomnia and weight loss.  Eyes: Denied eye symptoms, eye pain, photophobia, vision change and visual disturbance.  Ears/Nose/Throat/Neck: Denied ear, nose, throat or neck symptoms, hearing loss, nasal discharge, sinus congestion and sore throat.  Cardiovascular: Denied cardiovascular symptoms, arrhythmia, chest pain/pressure, edema, exercise intolerance, orthopnea and palpitations.  Respiratory: Denied pulmonary symptoms, asthma, pleuritic pain, productive sputum, cough, dyspnea and wheezing.  Gastrointestinal: Denied, gastro-esophageal reflux, melena, nausea and vomiting.  Genitourinary: See HPI for additional information.  Musculoskeletal: Denied musculoskeletal symptoms, stiffness, swelling, muscle weakness and myalgia.  Dermatologic: Denied dermatology symptoms, rash and scar.  Neurologic: Denied neurology symptoms, dizziness, headache, neck pain and syncope.  Psychiatric: Denied psychiatric symptoms, anxiety and depression.  Endocrine: Denied endocrine symptoms including hot flashes and night sweats.   OB History  Gravida Para Term Preterm AB Living  '3 2 2   1 2  '$ SAB IAB Ectopic Multiple Live Births  1       2    # Outcome Date GA Lbr Len/2nd Weight Sex Delivery Anes PTL Lv  3 SAB           2 Term           1 Term             Past Medical History:  Diagnosis Date   GERD (gastroesophageal reflux disease)    Herpes    Hypertension     Past Surgical History:  Procedure  Laterality Date   BREAST BIOPSY Left 2016      SOCIAL HISTORY:  Social History   Tobacco Use  Smoking Status Never  Smokeless Tobacco Never   Social History   Substance and Sexual Activity  Alcohol Use Yes   Alcohol/week: 2.0 standard drinks of alcohol   Types: 2 Standard drinks or equivalent per week    Social History   Substance and Sexual Activity  Drug Use Never    Family History  Problem Relation Age of Onset   Hypertension Mother    Diabetes Mother    Rheum arthritis Mother    Cancer Father    ADD / ADHD Daughter    ADD / ADHD Daughter    Alzheimer's disease Maternal Grandmother    Stroke Maternal Grandfather    Ovarian cancer Paternal Grandmother     ALLERGIES:  Bactrim [sulfamethoxazole-trimethoprim]  MEDS:   Current Outpatient Medications on File Prior to Visit  Medication Sig Dispense Refill   buPROPion (WELLBUTRIN XL) 150 MG 24 hr tablet Take 1 tablet (150 mg total) by mouth daily. 30 tablet 0   folic acid (FOLVITE) 1 MG tablet Take 1 mg by mouth daily.     gabapentin (NEURONTIN) 100 MG capsule Take 100 mg by mouth 2 (two) times daily.     levonorgestrel (MIRENA) 20 MCG/DAY IUD 1 each by Intrauterine route once.     methotrexate (RHEUMATREX) 2.5 MG tablet Take 15 mg by mouth once a week.     phentermine 37.5 MG capsule Take 1 capsule (37.5 mg total) by  mouth every morning. 30 capsule 0   REMICADE 100 MG injection Inject into the vein.     No current facility-administered medications on file prior to visit.    No orders of the defined types were placed in this encounter.    Physical examination There were no vitals taken for this visit.  General NAD, Conversant  HEENT Atraumatic; Op clear with mmm.  Normo-cephalic. Pupils reactive. Anicteric sclerae  Thyroid/Neck Smooth without nodularity or enlargement. Normal ROM.  Neck Supple.  Skin No rashes, lesions or ulceration. Normal palpated skin turgor. No nodularity.  Breasts: No masses or  discharge.  Symmetric.  No axillary adenopathy.  Lungs: Clear to auscultation.No rales or wheezes. Normal Respiratory effort, no retractions.  Heart: NSR.  No murmurs or rubs appreciated. No peripheral edema  Abdomen: Soft.  Non-tender.  No masses.  No HSM. No hernia  Extremities: Moves all appropriately.  Normal ROM for age. No lymphadenopathy.  Neuro: Oriented to PPT.  Normal mood. Normal affect.     Pelvic: Deferred to the OR  -see previous exam and note difficulties.   Assessment:   [id number] Patient Active Problem List   Diagnosis Date Noted   Chronic idiopathic constipation 10/12/2022   Malodorous urine 08/09/2022   Encounter for weight management 08/09/2022   Arthralgia of left hip 07/12/2022   Breast pain, left 02/05/2022   Leg cramps 01/19/2022   Class 1 obesity with serious comorbidity and body mass index (BMI) of 31.0 to 31.9 in adult 01/19/2022   Long-term use of immunosuppressant medication 12/24/2021   Rheumatoid arthritis involving multiple sites with positive rheumatoid factor ([place]) 12/24/2021   Injury of triangular fibrocartilage complex of left wrist 123456   Cyclic citrullinated peptide (CCP) antibody positive 10/29/2021   Cervical spondylosis with radiculopathy 10/07/2021   Gastroesophageal reflux disease without esophagitis 10/07/2021   Furuncle 08/26/2021   Cystitis 08/04/2021   Cervical paraspinal muscle spasm [id number]   Metabolic syndrome 123XX123   Rotator cuff arthropathy of left shoulder 05/27/2021   Primary hypertension 05/15/2021   Annual physical exam 04/03/2021   Fungal infection of skin 04/03/2021   Elevated blood pressure reading 04/03/2021   Dysplasia of cervix, low grade (CIN 1) 08/31/2018    1. CIN I (cervical intraepithelial neoplasia I)     The CIN-1 is endocervical   Very difficult exam.  Cervix obviously status post previous procedure (LEEP)   Plan:   Orders: No orders of the defined types were placed in this encounter.     1.  LEEP conization 2.  Possible replacement of IUD  LEEP The LEEP has been explained to the patient in detail; risks/benefits reviewed.  The risks include, but are not limited to, bleeding, infection, and the possibility of cervical stenosis or cervical incompetence.  The patient had previously been given information regarding abnormal PAP smears and their relationship to HPV.  We have discussed the natural course and history of HPV, the possibility of incomplete treatment by the LEEP, as well as the possibility of recurrence.  I have reviewed the consent form for LEEP with her, and she fully understands its contents.  We have discussed the procedure itself. I have informed her that following the LEEP she should refrain from intercourse and the use of tampons for three weeks, and that she should also expect some spotting and brown/black discharge over the next several days.  We have discussed the fact that vaginal bleeding, differentiated from spotting, is not normal and that if she should have this  complication, she should contact me immediately.  The follow-up after LEEP will be PAP smears or viral typing performed at regular intervals for up to 3 years.  Should these all prove to be normal, she will then be back on typical cervical screening.  I have answered all of her questions, and I believe she has an adequate understanding of the LEEP, its implications, and the necessity of follow-up care.  I spent 34 minutes involved in the care of this patient preparing to see the patient by obtaining and reviewing her medical history (including labs, imaging tests and prior procedures), documenting clinical information in the electronic health record (EHR), counseling and coordinating care plans, writing and sending prescriptions, ordering tests or procedures and in direct communicating with the patient and medical staff discussing pertinent items from her history and physical exam.  Finis Bud,  M.D. 01/05/2023 2:38 PM

## 2023-01-05 NOTE — Progress Notes (Signed)
Patient presents today for a pre-op exam following CIN 1 diagnosis. She states no additional concerns today.

## 2023-01-05 NOTE — Telephone Encounter (Signed)
Requested medication (s) are due for refill today:   Yes  Requested medication (s) are on the active medication list:   Yes  Future visit scheduled:   Yes 01/10/2023 with Dr. Zigmund Daniel   Last ordered: 12/13/2022 #30, 0 refills  Returned because a 90 day supply and a DX Code are being requested   Requested Prescriptions  Pending Prescriptions Disp Refills   buPROPion (WELLBUTRIN XL) 150 MG 24 hr tablet [Pharmacy Med Name: BUPROPION HCL XL 150 MG TABLET] 90 tablet 1    Sig: TAKE 1 [place]     Psychiatry: Antidepressants - bupropion Passed - 01/04/2023  1:31 PM      Passed - Cr in normal range and within 360 days    Creat  Date Value Ref Range Status  04/07/2021 0.90 0.50 - 1.10 mg/dL Final   Creatinine, Ser  Date Value Ref Range Status  01/19/2022 0.92 0.57 - 1.00 mg/dL Final         Passed - AST in normal range and within 360 days    AST  Date Value Ref Range Status  01/19/2022 16 0 - 40 IU/L Final         Passed - ALT in normal range and within 360 days    ALT  Date Value Ref Range Status  01/19/2022 21 0 - 32 IU/L Final         Passed - Last BP in normal range    BP Readings from Last 1 Encounters:  12/22/22 125/85         Passed - Valid encounter within last 6 months    Recent Outpatient Visits           3 weeks ago Encounter for weight management   [facility] [place], Earley Abide, MD   2 months ago Encounter for weight management   [facility] [place], Earley Abide, MD   4 months ago Malodorous urine   [facility], Earley Abide, MD   5 months ago Class 1 obesity with serious comorbidity and body mass index (BMI) of 31.0 to 31.9 in adult, unspecified obesity type   [facility] at [facility], Earley Abide, MD   7 months ago Class 1 obesity with serious  comorbidity and body mass index (BMI) of 31.0 to 31.9 in adult, unspecified obesity type   [facility] at [facility], Earley Abide, MD       Future Appointments             In 5 days Zigmund Daniel, Earley Abide, MD [place] at [facility], [facility]

## 2023-01-05 NOTE — H&P (View-Only) (Signed)
     PRE-OPERATIVE HISTORY AND PHYSICAL EXAM  PCP:  Matthews, Jason J, MD Subjective:   HPI:  Brandy Vasquez is a 47 y.o. G3P2012.  No LMP recorded. (Menstrual status: IUD).  She presents today for a pre-op discussion and PE.  She has the following symptoms: Inadequate colposcopy-very difficult exam-CIN-1 by endocervical curettage.  This is a recurrence  Patient has completed childbearing  Review of Systems:   Constitutional: Denied constitutional symptoms, night sweats, recent illness, fatigue, fever, insomnia and weight loss.  Eyes: Denied eye symptoms, eye pain, photophobia, vision change and visual disturbance.  Ears/Nose/Throat/Neck: Denied ear, nose, throat or neck symptoms, hearing loss, nasal discharge, sinus congestion and sore throat.  Cardiovascular: Denied cardiovascular symptoms, arrhythmia, chest pain/pressure, edema, exercise intolerance, orthopnea and palpitations.  Respiratory: Denied pulmonary symptoms, asthma, pleuritic pain, productive sputum, cough, dyspnea and wheezing.  Gastrointestinal: Denied, gastro-esophageal reflux, melena, nausea and vomiting.  Genitourinary: See HPI for additional information.  Musculoskeletal: Denied musculoskeletal symptoms, stiffness, swelling, muscle weakness and myalgia.  Dermatologic: Denied dermatology symptoms, rash and scar.  Neurologic: Denied neurology symptoms, dizziness, headache, neck pain and syncope.  Psychiatric: Denied psychiatric symptoms, anxiety and depression.  Endocrine: Denied endocrine symptoms including hot flashes and night sweats.   OB History  Gravida Para Term Preterm AB Living  3 2 2   1 2  SAB IAB Ectopic Multiple Live Births  1       2    # Outcome Date GA Lbr Len/2nd Weight Sex Delivery Anes PTL Lv  3 SAB           2 Term           1 Term             Past Medical History:  Diagnosis Date   GERD (gastroesophageal reflux disease)    Herpes    Hypertension     Past Surgical History:  Procedure  Laterality Date   BREAST BIOPSY Left 2016      SOCIAL HISTORY:  Social History   Tobacco Use  Smoking Status Never  Smokeless Tobacco Never   Social History   Substance and Sexual Activity  Alcohol Use Yes   Alcohol/week: 2.0 standard drinks of alcohol   Types: 2 Standard drinks or equivalent per week    Social History   Substance and Sexual Activity  Drug Use Never    Family History  Problem Relation Age of Onset   Hypertension Mother    Diabetes Mother    Rheum arthritis Mother    Cancer Father    ADD / ADHD Daughter    ADD / ADHD Daughter    Alzheimer's disease Maternal Grandmother    Stroke Maternal Grandfather    Ovarian cancer Paternal Grandmother     ALLERGIES:  Bactrim [sulfamethoxazole-trimethoprim]  MEDS:   Current Outpatient Medications on File Prior to Visit  Medication Sig Dispense Refill   buPROPion (WELLBUTRIN XL) 150 MG 24 hr tablet Take 1 tablet (150 mg total) by mouth daily. 30 tablet 0   folic acid (FOLVITE) 1 MG tablet Take 1 mg by mouth daily.     gabapentin (NEURONTIN) 100 MG capsule Take 100 mg by mouth 2 (two) times daily.     levonorgestrel (MIRENA) 20 MCG/DAY IUD 1 each by Intrauterine route once.     methotrexate (RHEUMATREX) 2.5 MG tablet Take 15 mg by mouth once a week.     phentermine 37.5 MG capsule Take 1 capsule (37.5 mg total) by   mouth every morning. 30 capsule 0   REMICADE 100 MG injection Inject into the vein.     No current facility-administered medications on file prior to visit.    No orders of the defined types were placed in this encounter.    Physical examination There were no vitals taken for this visit.  General NAD, Conversant  HEENT Atraumatic; Op clear with mmm.  Normo-cephalic. Pupils reactive. Anicteric sclerae  Thyroid/Neck Smooth without nodularity or enlargement. Normal ROM.  Neck Supple.  Skin No rashes, lesions or ulceration. Normal palpated skin turgor. No nodularity.  Breasts: No masses or  discharge.  Symmetric.  No axillary adenopathy.  Lungs: Clear to auscultation.No rales or wheezes. Normal Respiratory effort, no retractions.  Heart: NSR.  No murmurs or rubs appreciated. No peripheral edema  Abdomen: Soft.  Non-tender.  No masses.  No HSM. No hernia  Extremities: Moves all appropriately.  Normal ROM for age. No lymphadenopathy.  Neuro: Oriented to PPT.  Normal mood. Normal affect.     Pelvic: Deferred to the OR  -see previous exam and note difficulties.   Assessment:   G3P2012 Patient Active Problem List   Diagnosis Date Noted   Chronic idiopathic constipation 10/12/2022   Malodorous urine 08/09/2022   Encounter for weight management 08/09/2022   Arthralgia of left hip 07/12/2022   Breast pain, left 02/05/2022   Leg cramps 01/19/2022   Class 1 obesity with serious comorbidity and body mass index (BMI) of 31.0 to 31.9 in adult 01/19/2022   Long-term use of immunosuppressant medication 12/24/2021   Rheumatoid arthritis involving multiple sites with positive rheumatoid factor (HCC) 12/24/2021   Injury of triangular fibrocartilage complex of left wrist 12/08/2021   Cyclic citrullinated peptide (CCP) antibody positive 10/29/2021   Cervical spondylosis with radiculopathy 10/07/2021   Gastroesophageal reflux disease without esophagitis 10/07/2021   Furuncle 08/26/2021   Cystitis 08/04/2021   Cervical paraspinal muscle spasm 08/04/2021   Metabolic syndrome 05/27/2021   Rotator cuff arthropathy of left shoulder 05/27/2021   Primary hypertension 05/15/2021   Annual physical exam 04/03/2021   Fungal infection of skin 04/03/2021   Elevated blood pressure reading 04/03/2021   Dysplasia of cervix, low grade (CIN 1) 08/31/2018    1. CIN I (cervical intraepithelial neoplasia I)     The CIN-1 is endocervical   Very difficult exam.  Cervix obviously status post previous procedure (LEEP)   Plan:   Orders: No orders of the defined types were placed in this encounter.     1.  LEEP conization 2.  Removal and replacement of IUD  Brandy Vasquez, M.D. 01/05/2023 2:38 PM  

## 2023-01-10 ENCOUNTER — Encounter: Payer: Self-pay | Admitting: Family Medicine

## 2023-01-10 ENCOUNTER — Encounter: Payer: Self-pay | Admitting: *Deleted

## 2023-01-10 ENCOUNTER — Telehealth: Payer: BC Managed Care – PPO | Admitting: Family Medicine

## 2023-01-13 ENCOUNTER — Telehealth: Payer: Self-pay | Admitting: Gastroenterology

## 2023-01-13 ENCOUNTER — Encounter: Payer: BC Managed Care – PPO | Admitting: Obstetrics and Gynecology

## 2023-01-13 NOTE — Telephone Encounter (Signed)
Pt called to schedule colonoscopy

## 2023-01-14 DIAGNOSIS — M0579 Rheumatoid arthritis with rheumatoid factor of multiple sites without organ or systems involvement: Secondary | ICD-10-CM | POA: Diagnosis not present

## 2023-01-14 DIAGNOSIS — M24852 Other specific joint derangements of left hip, not elsewhere classified: Secondary | ICD-10-CM | POA: Diagnosis not present

## 2023-01-14 DIAGNOSIS — Z7960 Long term (current) use of unspecified immunomodulators and immunosuppressants: Secondary | ICD-10-CM | POA: Diagnosis not present

## 2023-01-17 ENCOUNTER — Other Ambulatory Visit: Payer: Self-pay | Admitting: *Deleted

## 2023-01-17 ENCOUNTER — Telehealth: Payer: Self-pay | Admitting: *Deleted

## 2023-01-17 DIAGNOSIS — Z1211 Encounter for screening for malignant neoplasm of colon: Secondary | ICD-10-CM

## 2023-01-17 MED ORDER — NA SULFATE-K SULFATE-MG SULF 17.5-3.13-1.6 GM/177ML PO SOLN: 1.0000 | Freq: Once | ORAL | 0 refills | Status: AC

## 2023-01-17 NOTE — Telephone Encounter (Signed)
Gastroenterology Pre-Procedure Review  Request Date: 02/14/2023 Requesting Physician: Dr. Marius Ditch  PATIENT REVIEW QUESTIONS: The patient responded to the following health history questions as indicated:    1. Are you having any GI issues? no 2. Do you have a personal history of Polyps? no 3. Do you have a family history of Colon Cancer or Polyps? no 4. Diabetes Mellitus? no 5. Joint replacements in the past 12 months?no 6. Major health problems in the past 3 months?no 7. Any artificial heart valves, MVP, or defibrillator?no    MEDICATIONS & ALLERGIES:    Patient reports the following regarding taking any anticoagulation/antiplatelet therapy:   Plavix, Coumadin, Eliquis, Xarelto, Lovenox, Pradaxa, Brilinta, or Effient? no Aspirin? no  Patient confirms/reports the following medications:  Current Outpatient Medications  Medication Sig Dispense Refill   Na Sulfate-K Sulfate-Mg Sulf 17.5-3.13-1.6 GM/177ML SOLN Take 1 kit by mouth once for 1 dose. 354 mL 0   buPROPion (WELLBUTRIN XL) 150 MG 24 hr tablet Take 1 tablet (150 mg total) by mouth daily. 30 tablet 0   folic acid (FOLVITE) 1 MG tablet Take 1 mg by mouth daily.     gabapentin (NEURONTIN) 100 MG capsule Take 100 mg by mouth 2 (two) times daily.     levonorgestrel (MIRENA) 20 MCG/DAY IUD 1 each by Intrauterine route once.     methotrexate (RHEUMATREX) 2.5 MG tablet Take 15 mg by mouth once a week.     phentermine 37.5 MG capsule Take 1 capsule (37.5 mg total) by mouth every morning. 30 capsule 0   REMICADE 100 MG injection Inject into the vein.     No current facility-administered medications for this visit.    Patient confirms/reports the following allergies:  Allergies  Allergen Reactions   Bactrim [Sulfamethoxazole-Trimethoprim] Rash    No orders of the defined types were placed in this encounter.   AUTHORIZATION INFORMATION Primary Insurance: 1D#: Group #:  Secondary Insurance: 1D#: Group #:  SCHEDULE  INFORMATION: Date: 02/14/2023 Time: Location: [facility]

## 2023-01-17 NOTE — Telephone Encounter (Signed)
Message left for patient to return my call.  

## 2023-01-17 NOTE — Telephone Encounter (Signed)
Colonoscopy 02/14/2023

## 2023-01-19 ENCOUNTER — Other Ambulatory Visit: Payer: Self-pay | Admitting: Rheumatology

## 2023-01-19 DIAGNOSIS — M24852 Other specific joint derangements of left hip, not elsewhere classified: Secondary | ICD-10-CM

## 2023-01-21 ENCOUNTER — Encounter
Admission: RE | Admit: 2023-01-21 | Discharge: 2023-01-21 | Disposition: A | Discharge status: Home / Self Care | Payer: BC Managed Care – PPO | Source: Ambulatory Visit | Attending: Obstetrics and Gynecology | Admitting: Obstetrics and Gynecology

## 2023-01-21 VITALS — Ht 66.0 in | Wt 185.0 lb

## 2023-01-21 DIAGNOSIS — I10 Essential (primary) hypertension: Secondary | ICD-10-CM

## 2023-01-21 DIAGNOSIS — Z01812 Encounter for preprocedural laboratory examination: Secondary | ICD-10-CM

## 2023-01-21 HISTORY — DX: Mild cervical dysplasia: N87.0

## 2023-01-21 HISTORY — DX: Rheumatoid arthritis, unspecified: M06.9

## 2023-01-21 NOTE — Patient Instructions (Addendum)
Your procedure is scheduled on: Monday, April 8 Report to the Registration Desk on the 1st floor of the [facility]. To find out your arrival time, please call [phone] between 1PM - 3PM on: Friday, April 5 If your arrival time is 6:00 am, do not arrive before that time as the [facility] entrance doors do not open until 6:00 am.  REMEMBER: Instructions that are not followed completely may result in serious medical risk, up to and including death; or upon the discretion of your surgeon and anesthesiologist your surgery may need to be rescheduled.  Do not eat or drink after midnight the night before surgery.  No gum chewing or hard candies.  One week prior to surgery: starting April 1 Stop Anti-inflammatories (NSAIDS) such as Advil, Aleve, Ibuprofen, Motrin, Naproxen, Naprosyn and Aspirin based products such as Excedrin, Goody's Powder, BC Powder. Stop ANY OVER THE COUNTER supplements until after surgery. You may however, continue to take Tylenol if needed for pain up until the day of surgery.  Continue taking all prescribed medications   DO NOT TAKE ANY MEDICATIONS THE MORNING OF SURGERY  No Alcohol for 24 hours before or after surgery.  No Smoking including e-cigarettes for 24 hours before surgery.  No chewable tobacco products for at least 6 hours before surgery.  No nicotine patches on the day of surgery.  Do not use any "recreational" drugs for at least a week (preferably 2 weeks) before your surgery.  Please be advised that the combination of cocaine and anesthesia may have negative outcomes, up to and including death. If you test positive for cocaine, your surgery will be cancelled.  On the morning of surgery brush your teeth with toothpaste and water, you may rinse your mouth with mouthwash if you wish. Do not swallow any toothpaste or mouthwash.  Do not wear jewelry, make-up, hairpins, clips or nail polish.  Do not wear lotions, powders, or perfumes.   Do not  shave body hair from the neck down 48 hours before surgery.  Contact lenses, hearing aids and dentures may not be worn into surgery.  Do not bring valuables to the hospital. [facility] is not responsible for any missing/lost belongings or valuables.   Notify your doctor if there is any change in your medical condition (cold, fever, infection).  Wear comfortable clothing (specific to your surgery type) to the hospital.  After surgery, you can help prevent lung complications by doing breathing exercises.  Take deep breaths and cough every 1-2 hours. Your doctor may order a device called an Incentive Spirometer to help you take deep breaths.  If you are being discharged the day of surgery, you will not be allowed to drive home. You will need a responsible individual to drive you home and stay with you for 24 hours after surgery.   If you are taking public transportation, you will need to have a responsible individual with you.  Please call the [facility] at [phone] if you have any questions about these instructions.  Surgery Visitation Policy:  Patients having surgery or a procedure may have two visitors.  Children under the age of 50 must have an adult with them who is not the patient.

## 2023-01-24 ENCOUNTER — Encounter
Admission: RE | Admit: 2023-01-24 | Discharge: 2023-01-24 | Disposition: A | Discharge status: Home / Self Care | Payer: BC Managed Care – PPO | Source: Ambulatory Visit | Attending: Obstetrics and Gynecology | Admitting: Obstetrics and Gynecology

## 2023-01-24 DIAGNOSIS — I10 Essential (primary) hypertension: Secondary | ICD-10-CM | POA: Insufficient documentation

## 2023-01-24 DIAGNOSIS — N870 Mild cervical dysplasia: Secondary | ICD-10-CM | POA: Insufficient documentation

## 2023-01-24 DIAGNOSIS — Z01812 Encounter for preprocedural laboratory examination: Secondary | ICD-10-CM

## 2023-01-24 DIAGNOSIS — Z01818 Encounter for other preprocedural examination: Secondary | ICD-10-CM | POA: Diagnosis not present

## 2023-01-24 LAB — TYPE AND SCREEN
ABO/RH(D): O POS
Antibody Screen: NEGATIVE

## 2023-01-31 ENCOUNTER — Encounter
Admission: RE | Disposition: A | Discharge status: Home / Self Care | Payer: Self-pay | Source: Home / Self Care | Attending: Obstetrics and Gynecology

## 2023-01-31 ENCOUNTER — Ambulatory Visit: Payer: BC Managed Care – PPO | Admitting: Certified Registered"

## 2023-01-31 ENCOUNTER — Encounter: Payer: Self-pay | Admitting: Obstetrics and Gynecology

## 2023-01-31 ENCOUNTER — Ambulatory Visit
Admission: RE | Admit: 2023-01-31 | Discharge: 2023-01-31 | Disposition: A | Discharge status: Home / Self Care | Payer: BC Managed Care – PPO | Attending: Obstetrics and Gynecology | Admitting: Obstetrics and Gynecology

## 2023-01-31 ENCOUNTER — Ambulatory Visit: Payer: BC Managed Care – PPO | Admitting: Urgent Care

## 2023-01-31 ENCOUNTER — Other Ambulatory Visit: Payer: Self-pay

## 2023-01-31 DIAGNOSIS — N870 Mild cervical dysplasia: Secondary | ICD-10-CM | POA: Diagnosis not present

## 2023-01-31 DIAGNOSIS — M0579 Rheumatoid arthritis with rheumatoid factor of multiple sites without organ or systems involvement: Secondary | ICD-10-CM | POA: Diagnosis not present

## 2023-01-31 DIAGNOSIS — I10 Essential (primary) hypertension: Secondary | ICD-10-CM | POA: Insufficient documentation

## 2023-01-31 DIAGNOSIS — Z30433 Encounter for removal and reinsertion of intrauterine contraceptive device: Secondary | ICD-10-CM | POA: Diagnosis not present

## 2023-01-31 DIAGNOSIS — Z01812 Encounter for preprocedural laboratory examination: Secondary | ICD-10-CM

## 2023-01-31 HISTORY — PX: IUD REMOVAL: SHX5392

## 2023-01-31 HISTORY — PX: LEEP: SHX91

## 2023-01-31 HISTORY — PX: INTRAUTERINE DEVICE (IUD) INSERTION: SHX5877

## 2023-01-31 LAB — ABO/RH: ABO/RH(D): O POS

## 2023-01-31 LAB — POCT PREGNANCY, URINE: Preg Test, Ur: NEGATIVE

## 2023-01-31 SURGERY — LEEP (LOOP ELECTROSURGICAL EXCISION PROCEDURE)
Anesthesia: General

## 2023-01-31 MED ORDER — FAMOTIDINE 20 MG PO TABS
20.0000 mg | ORAL_TABLET | Freq: Once | ORAL | Status: AC
  Administered 2023-01-31: 20 mg via ORAL

## 2023-01-31 MED ORDER — CHLORHEXIDINE GLUCONATE 0.12 % MT SOLN
OROMUCOSAL | Status: AC
  Filled 2023-01-31: qty 15

## 2023-01-31 MED ORDER — DEXAMETHASONE SODIUM PHOSPHATE 10 MG/ML IJ SOLN
INTRAMUSCULAR | Status: DC | PRN
  Administered 2023-01-31: 10 mg via INTRAVENOUS

## 2023-01-31 MED ORDER — ACETAMINOPHEN 10 MG/ML IV SOLN
INTRAVENOUS | Status: AC
  Filled 2023-01-31: qty 100

## 2023-01-31 MED ORDER — ACETAMINOPHEN 10 MG/ML IV SOLN
INTRAVENOUS | Status: DC | PRN
  Administered 2023-01-31: 1000 mg via INTRAVENOUS

## 2023-01-31 MED ORDER — LEVONORGESTREL 20 MCG/DAY IU IUD
1.0000 | INTRAUTERINE_SYSTEM | Freq: Once | INTRAUTERINE | Status: DC
  Filled 2023-01-31: qty 1

## 2023-01-31 MED ORDER — OXYCODONE HCL 5 MG PO TABS
ORAL_TABLET | ORAL | Status: AC
  Filled 2023-01-31: qty 1

## 2023-01-31 MED ORDER — FENTANYL CITRATE (PF) 100 MCG/2ML IJ SOLN: 25.0000 ug | INTRAMUSCULAR | Status: DC | PRN

## 2023-01-31 MED ORDER — ORAL CARE MOUTH RINSE: 15.0000 mL | Freq: Once | OROMUCOSAL | Status: AC

## 2023-01-31 MED ORDER — OXYCODONE HCL 5 MG/5ML PO SOLN: 5.0000 mg | Freq: Once | ORAL | Status: AC | PRN

## 2023-01-31 MED ORDER — IODINE STRONG (LUGOLS) 5 % PO SOLN
ORAL | Status: DC | PRN
  Administered 2023-01-31: .1 mL

## 2023-01-31 MED ORDER — HYDROCODONE-ACETAMINOPHEN 5-325 MG PO TABS: 1.0000 | ORAL_TABLET | Freq: Four times a day (QID) | ORAL | 0 refills | Status: DC | PRN

## 2023-01-31 MED ORDER — LIDOCAINE-EPINEPHRINE 1 %-1:100000 IJ SOLN
INTRAMUSCULAR | Status: DC | PRN
  Administered 2023-01-31: 5 mL

## 2023-01-31 MED ORDER — ONDANSETRON HCL 4 MG/2ML IJ SOLN
INTRAMUSCULAR | Status: DC | PRN
  Administered 2023-01-31: 4 mg via INTRAVENOUS

## 2023-01-31 MED ORDER — FENTANYL CITRATE (PF) 100 MCG/2ML IJ SOLN
INTRAMUSCULAR | Status: AC
  Filled 2023-01-31: qty 2

## 2023-01-31 MED ORDER — OXYCODONE HCL 5 MG PO TABS
5.0000 mg | ORAL_TABLET | Freq: Once | ORAL | Status: AC | PRN
  Administered 2023-01-31: 5 mg via ORAL

## 2023-01-31 MED ORDER — FENTANYL CITRATE (PF) 100 MCG/2ML IJ SOLN
INTRAMUSCULAR | Status: DC | PRN
  Administered 2023-01-31: 50 ug via INTRAVENOUS

## 2023-01-31 MED ORDER — FERRIC SUBSULFATE 259 MG/GM EX SOLN
CUTANEOUS | Status: DC | PRN
  Administered 2023-01-31: 1 via TOPICAL

## 2023-01-31 MED ORDER — FAMOTIDINE 20 MG PO TABS
ORAL_TABLET | ORAL | Status: AC
  Filled 2023-01-31: qty 1

## 2023-01-31 MED ORDER — CHLORHEXIDINE GLUCONATE 0.12 % MT SOLN
15.0000 mL | Freq: Once | OROMUCOSAL | Status: AC
  Administered 2023-01-31: 15 mL via OROMUCOSAL

## 2023-01-31 MED ORDER — POVIDONE-IODINE 10 % EX SWAB: 2.0000 | Freq: Once | CUTANEOUS | Status: DC

## 2023-01-31 MED ORDER — MIDAZOLAM HCL 2 MG/2ML IJ SOLN
INTRAMUSCULAR | Status: AC
  Filled 2023-01-31: qty 2

## 2023-01-31 MED ORDER — LACTATED RINGERS IV SOLN: INTRAVENOUS | Status: DC

## 2023-01-31 MED ORDER — LEVONORGESTREL 20 MCG/DAY IU IUD
INTRAUTERINE_SYSTEM | INTRAUTERINE | Status: AC
  Filled 2023-01-31: qty 1

## 2023-01-31 MED ORDER — LIDOCAINE HCL (CARDIAC) PF 100 MG/5ML IV SOSY
PREFILLED_SYRINGE | INTRAVENOUS | Status: DC | PRN
  Administered 2023-01-31: 100 mg via INTRAVENOUS

## 2023-01-31 MED ORDER — PROPOFOL 10 MG/ML IV BOLUS
INTRAVENOUS | Status: DC | PRN
  Administered 2023-01-31: 150 ug/kg/min via INTRAVENOUS
  Administered 2023-01-31: 100 mg via INTRAVENOUS

## 2023-01-31 MED ORDER — MIDAZOLAM HCL 2 MG/2ML IJ SOLN
INTRAMUSCULAR | Status: DC | PRN
  Administered 2023-01-31: 2 mg via INTRAVENOUS

## 2023-01-31 SURGICAL SUPPLY — 44 items
APL SWBSTK 6 STRL LF DISP (MISCELLANEOUS) ×1
APPLICATOR COTTON TIP 6 STRL (MISCELLANEOUS) ×1 IMPLANT
APPLICATOR COTTON TIP 6IN STRL (MISCELLANEOUS) ×1
CUP MEDICINE 2OZ PLAST GRAD ST (MISCELLANEOUS) ×1 IMPLANT
DRAPE UNDER BUTTOCK W/FLU (DRAPES) ×1 IMPLANT
DRSG TELFA 3X8 NADH STRL (GAUZE/BANDAGES/DRESSINGS) ×1 IMPLANT
ELECT LEEP BALL 5MM 12CM (MISCELLANEOUS) ×1
ELECT LEEP LOOP 1.0CM .7CM (MISCELLANEOUS) ×1
ELECT LEEP LOOP 20X10 R2010 (MISCELLANEOUS) ×1
ELECT LOOP 1.0X1.0CM R1010 (MISCELLANEOUS)
ELECT REM PT RETURN 9FT ADLT (ELECTROSURGICAL) ×1
ELECTRODE LEEP BALL 5MM 12CM (MISCELLANEOUS) ×1 IMPLANT
ELECTRODE LEEP LOOP 1.0CM .7CM (MISCELLANEOUS) ×1 IMPLANT
ELECTRODE LEP LOOP 20X10 R2010 (MISCELLANEOUS) ×1 IMPLANT
ELECTRODE LOOP 1.0X1.0CM R1010 (MISCELLANEOUS) ×1 IMPLANT
ELECTRODE REM PT RTRN 9FT ADLT (ELECTROSURGICAL) ×1 IMPLANT
GLOVE INDICATOR 7.0 STRL GRN (GLOVE) ×1 IMPLANT
GLOVE PI ORTHO PRO STRL 7.5 (GLOVE) ×1 IMPLANT
GOWN STRL REUS W/ TWL LRG LVL3 (GOWN DISPOSABLE) ×2 IMPLANT
GOWN STRL REUS W/TWL LRG LVL3 (GOWN DISPOSABLE) ×2
HANDLE YANKAUER SUCT BULB TIP (MISCELLANEOUS) ×1 IMPLANT
KIT TURNOVER CYSTO (KITS) ×1 IMPLANT
LABEL OR SOLS (LABEL) ×1 IMPLANT
MANIFOLD NEPTUNE II (INSTRUMENTS) ×1 IMPLANT
NDL SPNL 22GX3.5 QUINCKE BK (NEEDLE) ×1 IMPLANT
NEEDLE SPNL 22GX3.5 QUINCKE BK (NEEDLE) ×1 IMPLANT
PACK DNC HYST (MISCELLANEOUS) ×1 IMPLANT
PAD OB MATERNITY 4.3X12.25 (PERSONAL CARE ITEMS) ×1 IMPLANT
PAD PREP 24X41 OB/GYN DISP (PERSONAL CARE ITEMS) ×1 IMPLANT
PENCIL SMOKE EVACUATOR (MISCELLANEOUS) ×1 IMPLANT
SCOPETTES 8  STERILE (MISCELLANEOUS) ×1
SCOPETTES 8 STERILE (MISCELLANEOUS) ×1 IMPLANT
SCRUB CHG 4% DYNA-HEX 4OZ (MISCELLANEOUS) ×1 IMPLANT
SET CYSTO W/LG BORE CLAMP LF (SET/KITS/TRAYS/PACK) IMPLANT
SOL PREP PVP 2OZ (MISCELLANEOUS) ×1
SOLUTION PREP PVP 2OZ (MISCELLANEOUS) ×1 IMPLANT
STRAW SMOKE EVAC LEEP 6150 NON (MISCELLANEOUS) ×1 IMPLANT
SYR 10ML LL (SYRINGE) ×1 IMPLANT
SYS MIRENA INTRAUTERINE (MISCELLANEOUS) ×1
SYSTEM MIRENA INTRAUTERINE (MISCELLANEOUS) IMPLANT
TOWEL OR 17X26 4PK STRL BLUE (TOWEL DISPOSABLE) ×1 IMPLANT
TRAP FLUID SMOKE EVACUATOR (MISCELLANEOUS) ×1 IMPLANT
TUBING CONNECTING 10 (TUBING) ×1 IMPLANT
WATER STERILE IRR 500ML POUR (IV SOLUTION) ×1 IMPLANT

## 2023-01-31 NOTE — OR Nursing (Signed)
IUD removed by Dr. Logan Bores and discarded per Dr. Logan Bores

## 2023-01-31 NOTE — Op Note (Signed)
   OPERATIVE NOTE 01/31/2023 9:08 AM  PRE-OPERATIVE DIAGNOSIS:  1) CIN-1 Endocervical 2) Very difficult speculum exam in office - cervix buried  3)  IUD  POST-OPERATIVE DIAGNOSIS:  1) Same  OPERATION:         IUD removal and replacement (Mirena)  - LEEP  SURGEON(S): Surgeon(s) and Role:    Linzie Collin, MD - Primary   ANESTHESIA: General  ESTIMATED BLOOD LOSS: Minimal   OPERATIVE FINDINGS: Cervix flush with the vaginal vault and somewhat buried.  Much easier identified in the operating room. **Significant future note, no further LEEP can be performed if this becomes necessary in the future.**  SPECIMEN:  ID Type Source Tests Collected by Time Destination  1 : endocervical curettings Tissue PATH Other SURGICAL PATHOLOGY Linzie Collin, MD 01/31/2023 [phone]   2 : cervix suture at 12:00 Tissue PATH Other SURGICAL PATHOLOGY Linzie Collin, MD 01/31/2023 0902     COMPLICATIONS: None  DRAINS: None  DISPOSITION: Stable to recovery room  DESCRIPTION OF PROCEDURE:      The patient was prepped and draped in the dorsal lithotomy position and placed under general anesthesia.  Strings of her IUD were grasped and the IUD was easily removed.  Cervix was again noted to be flush with the vaginal vault and somewhat buried by vagina.  Cervix was injected with long-acting anesthetic with epinephrine.  Lugol's solution was used to better identify the cervix.  Gentle palpation with a long pickups allowed identification of the cervix by palpation in the vagina and their borders. A single pass was used to remove exocervix using blended current in the larger LEEP loop.  A small endocervical specimen was then taken and a high hat affect making sure to remove part of the endocervical canal.  Hemostasis was excellent.  Using the ball electrode the base of the defect and the edges of the defect were systematically cauterized.  The uterus was sounded and dilated to accommodate an IUD.  IUD was  placed in the usual manner.  Monsel solution was applied to the cervix.  Hemostasis was again noted.  The IUD strings were noted to be appropriate length. Patient went to recovery room in stable condition.   Elonda Husky, M.D. 01/31/2023 9:08 AM

## 2023-01-31 NOTE — Transfer of Care (Signed)
Immediate Anesthesia Transfer of Care Note  Patient: Brandy Vasquez  Procedure(s) Performed: LOOP ELECTROSURGICAL EXCISION PROCEDURE (LEEP) INTRAUTERINE DEVICE (IUD) REMOVAL INTRAUTERINE DEVICE (IUD) INSERTION  Patient Location: PACU  Anesthesia Type:General  Level of Consciousness: awake and alert   Airway & Oxygen Therapy: Patient Spontanous Breathing  Post-op Assessment: Report given to RN and Post -op Vital signs reviewed and stable  Post vital signs: stable  Last Vitals:  Vitals Value Taken Time  BP    Temp    Pulse 68 01/31/23 0917  Resp 16 01/31/23 0917  SpO2 77 % 01/31/23 0917  Vitals shown include unvalidated device data.  Last Pain:  Vitals:   01/31/23 0814  TempSrc: Oral  PainSc: 0-No pain         Complications: No notable events documented.

## 2023-01-31 NOTE — Interval H&P Note (Signed)
History and Physical Interval Note:  01/31/2023 8:08 AM  Brandy Vasquez  has presented today for surgery, with the diagnosis of CIN-1 Endocervical.  The various methods of treatment have been discussed with the patient and family. After consideration of risks, benefits and other options for treatment, the patient has consented to  Procedure(s): LOOP ELECTROSURGICAL EXCISION PROCEDURE (LEEP) (N/A) INTRAUTERINE DEVICE (IUD) REMOVAL (N/A) INTRAUTERINE DEVICE (IUD) INSERTION (N/A) as a surgical intervention.  The patient's history has been reviewed, patient examined, no change in status, stable for surgery.  I have reviewed the patient's chart and labs.  Questions were answered to the patient's satisfaction.     Brennan Bailey

## 2023-01-31 NOTE — Discharge Instructions (Signed)

## 2023-01-31 NOTE — Anesthesia Procedure Notes (Signed)
Procedure Name: LMA Insertion Date/Time: 01/31/2023 8:47 AM  Performed by: Maryla Morrow., CRNAPre-anesthesia Checklist: Patient identified, Patient being monitored, Timeout performed, Emergency Drugs available and Suction available Patient Re-evaluated:Patient Re-evaluated prior to induction Oxygen Delivery Method: Circle system utilized Preoxygenation: Pre-oxygenation with 100% oxygen Induction Type: IV induction Ventilation: Mask ventilation without difficulty LMA: LMA inserted LMA Size: 4.0 Tube type: Oral Number of attempts: 1 Placement Confirmation: positive ETCO2 and breath sounds checked- equal and bilateral Tube secured with: Tape Dental Injury: Teeth and Oropharynx as per pre-operative assessment

## 2023-01-31 NOTE — Anesthesia Preprocedure Evaluation (Addendum)
Anesthesia Evaluation  Patient identified by MRN, date of birth, ID band Patient awake    Reviewed: Allergy & Precautions, NPO status , Patient's Chart, lab work & pertinent test results  History of Anesthesia Complications Negative for: history of anesthetic complications  Airway Mallampati: III  TM Distance: >3 FB Neck ROM: full    Dental  (+) Chipped, Poor Dentition   Pulmonary neg pulmonary ROS, neg shortness of breath   Pulmonary exam normal        Cardiovascular Exercise Tolerance: Good hypertension, (-) angina Normal cardiovascular exam     Neuro/Psych  Neuromuscular disease  negative psych ROS   GI/Hepatic Neg liver ROS,GERD  Controlled,,  Endo/Other  negative endocrine ROS    Renal/GU      Musculoskeletal   Abdominal   Peds  Hematology negative hematology ROS (+)   Anesthesia Other Findings Past Medical History: No date: Dysplasia of cervix, low grade (CIN 1) No date: GERD (gastroesophageal reflux disease) No date: Herpes 05/15/2021: Hypertension     Comment:  on lisinopril/hctz10/12.5 for about a month only No date: Rheumatoid arthritis  Past Surgical History: 2016: BREAST BIOPSY; Left     Reproductive/Obstetrics negative OB ROS                             Anesthesia Physical Anesthesia Plan  ASA: 2  Anesthesia Plan: General LMA   Post-op Pain Management:    Induction: Intravenous  PONV Risk Score and Plan: Dexamethasone, Ondansetron, Midazolam and Treatment may vary due to age or medical condition  Airway Management Planned: LMA  Additional Equipment:   Intra-op Plan:   Post-operative Plan: Extubation in OR  Informed Consent: I have reviewed the patients History and Physical, chart, labs and discussed the procedure including the risks, benefits and alternatives for the proposed anesthesia with the patient or authorized representative who has indicated  his/her understanding and acceptance.     Dental Advisory Given  Plan Discussed with: Anesthesiologist, CRNA and Surgeon  Anesthesia Plan Comments: (Patient consented for risks of anesthesia including but not limited to:  - adverse reactions to medications - damage to eyes, teeth, lips or other oral mucosa - nerve damage due to positioning  - sore throat or hoarseness - Damage to heart, brain, nerves, lungs, other parts of body or loss of life  Patient voiced understanding.)       Anesthesia Quick Evaluation

## 2023-01-31 NOTE — Anesthesia Postprocedure Evaluation (Signed)
Anesthesia Post Note  Patient: Brandy Vasquez  Procedure(s) Performed: LOOP ELECTROSURGICAL EXCISION PROCEDURE (LEEP) INTRAUTERINE DEVICE (IUD) REMOVAL INTRAUTERINE DEVICE (IUD) INSERTION  Patient location during evaluation: PACU Anesthesia Type: General Level of consciousness: awake and alert Pain management: pain level controlled Vital Signs Assessment: post-procedure vital signs reviewed and stable Respiratory status: spontaneous breathing, nonlabored ventilation, respiratory function stable and patient connected to nasal cannula oxygen Cardiovascular status: blood pressure returned to baseline and stable Postop Assessment: no apparent nausea or vomiting Anesthetic complications: no   There were no known notable events for this encounter.   Last Vitals:  Vitals:   01/31/23 0945 01/31/23 1004  BP: (!) 130/90 (!) 145/101  Pulse: 73 75  Resp: 18 17  Temp: 36.7 C 36.6 C  SpO2: 98% 98%    Last Pain:  Vitals:   01/31/23 1004  TempSrc: Temporal  PainSc: 3                  Cleda Mccreedy Quintasha Gren

## 2023-02-01 ENCOUNTER — Encounter: Payer: Self-pay | Admitting: Rheumatology

## 2023-02-01 ENCOUNTER — Encounter: Payer: Self-pay | Admitting: Obstetrics and Gynecology

## 2023-02-02 LAB — SURGICAL PATHOLOGY

## 2023-02-03 ENCOUNTER — Encounter: Payer: Self-pay | Admitting: Obstetrics and Gynecology

## 2023-02-03 ENCOUNTER — Ambulatory Visit
Admission: RE | Admit: 2023-02-03 | Discharge: 2023-02-03 | Disposition: A | Discharge status: Home / Self Care | Payer: BC Managed Care – PPO | Source: Ambulatory Visit | Attending: Rheumatology | Admitting: Rheumatology

## 2023-02-03 DIAGNOSIS — M24852 Other specific joint derangements of left hip, not elsewhere classified: Secondary | ICD-10-CM

## 2023-02-03 DIAGNOSIS — M25552 Pain in left hip: Secondary | ICD-10-CM | POA: Diagnosis not present

## 2023-02-09 ENCOUNTER — Encounter: Payer: BC Managed Care – PPO | Admitting: Obstetrics and Gynecology

## 2023-02-11 ENCOUNTER — Encounter: Payer: Self-pay | Admitting: Gastroenterology

## 2023-02-14 ENCOUNTER — Encounter: Payer: Self-pay | Admitting: Gastroenterology

## 2023-02-14 ENCOUNTER — Ambulatory Visit: Payer: BC Managed Care – PPO | Admitting: Certified Registered"

## 2023-02-14 ENCOUNTER — Ambulatory Visit
Admission: RE | Admit: 2023-02-14 | Discharge: 2023-02-14 | Disposition: A | Discharge status: Home / Self Care | Payer: BC Managed Care – PPO | Attending: Gastroenterology | Admitting: Gastroenterology

## 2023-02-14 ENCOUNTER — Encounter
Admission: RE | Disposition: A | Discharge status: Home / Self Care | Payer: Self-pay | Source: Home / Self Care | Attending: Gastroenterology

## 2023-02-14 DIAGNOSIS — Z1211 Encounter for screening for malignant neoplasm of colon: Secondary | ICD-10-CM | POA: Diagnosis not present

## 2023-02-14 DIAGNOSIS — K644 Residual hemorrhoidal skin tags: Secondary | ICD-10-CM | POA: Insufficient documentation

## 2023-02-14 DIAGNOSIS — M069 Rheumatoid arthritis, unspecified: Secondary | ICD-10-CM | POA: Diagnosis not present

## 2023-02-14 DIAGNOSIS — K649 Unspecified hemorrhoids: Secondary | ICD-10-CM | POA: Diagnosis not present

## 2023-02-14 HISTORY — PX: COLONOSCOPY WITH PROPOFOL: SHX5780

## 2023-02-14 LAB — POCT PREGNANCY, URINE: Preg Test, Ur: NEGATIVE

## 2023-02-14 SURGERY — COLONOSCOPY WITH PROPOFOL
Anesthesia: General

## 2023-02-14 MED ORDER — LIDOCAINE 2% (20 MG/ML) 5 ML SYRINGE
INTRAMUSCULAR | Status: DC | PRN
  Administered 2023-02-14: 20 mg via INTRAVENOUS

## 2023-02-14 MED ORDER — PROPOFOL 10 MG/ML IV BOLUS
INTRAVENOUS | Status: DC | PRN
  Administered 2023-02-14: 100 mg via INTRAVENOUS

## 2023-02-14 MED ORDER — PROPOFOL 500 MG/50ML IV EMUL
INTRAVENOUS | Status: DC | PRN
  Administered 2023-02-14: 120 ug/kg/min via INTRAVENOUS

## 2023-02-14 MED ORDER — MIDAZOLAM HCL 2 MG/2ML IJ SOLN
INTRAMUSCULAR | Status: AC
  Filled 2023-02-14: qty 2

## 2023-02-14 MED ORDER — MIDAZOLAM HCL 5 MG/5ML IJ SOLN
INTRAMUSCULAR | Status: DC | PRN
  Administered 2023-02-14: 2 mg via INTRAVENOUS

## 2023-02-14 MED ORDER — SODIUM CHLORIDE 0.9 % IV SOLN
INTRAVENOUS | Status: DC
  Administered 2023-02-14: 1000 mL via INTRAVENOUS

## 2023-02-14 NOTE — Anesthesia Preprocedure Evaluation (Signed)
Anesthesia Evaluation  Patient identified by MRN, date of birth, ID band Patient awake    Reviewed: Allergy & Precautions, NPO status , Patient's Chart, lab work & pertinent test results  History of Anesthesia Complications Negative for: history of anesthetic complications  Airway Mallampati: III  TM Distance: >3 FB Neck ROM: full    Dental  (+) Chipped, Poor Dentition   Pulmonary neg pulmonary ROS, neg shortness of breath   Pulmonary exam normal        Cardiovascular Exercise Tolerance: Good hypertension, (-) angina Normal cardiovascular exam     Neuro/Psych  Neuromuscular disease  negative psych ROS   GI/Hepatic Neg liver ROS,GERD  Controlled,,  Endo/Other  negative endocrine ROS    Renal/GU      Musculoskeletal   Abdominal   Peds  Hematology negative hematology ROS (+)   Anesthesia Other Findings Past Medical History: No date: Dysplasia of cervix, low grade (CIN 1) No date: GERD (gastroesophageal reflux disease) No date: Herpes 05/15/2021: Hypertension     Comment:  on lisinopril/hctz10/12.5 for about a month only No date: Rheumatoid arthritis  Past Surgical History: 2016: BREAST BIOPSY; Left     Reproductive/Obstetrics negative OB ROS                              Anesthesia Physical Anesthesia Plan  ASA: 2  Anesthesia Plan: General LMA   Post-op Pain Management:    Induction: Intravenous  PONV Risk Score and Plan: Dexamethasone, Ondansetron, Midazolam and Treatment may vary due to age or medical condition  Airway Management Planned: Nasal Cannula  Additional Equipment: None  Intra-op Plan:   Post-operative Plan: Extubation in OR  Informed Consent: I have reviewed the patients History and Physical, chart, labs and discussed the procedure including the risks, benefits and alternatives for the proposed anesthesia with the patient or authorized representative who  has indicated his/her understanding and acceptance.     Dental Advisory Given  Plan Discussed with: Anesthesiologist, CRNA and Surgeon  Anesthesia Plan Comments: (Discussed risks of anesthesia with patient, including possibility of difficulty with spontaneous ventilation under anesthesia necessitating airway intervention, PONV, and rare risks such as cardiac or respiratory or neurological events, and allergic reactions. Discussed the role of CRNA in patient's perioperative care. Patient understands.)        Anesthesia Quick Evaluation

## 2023-02-14 NOTE — Transfer of Care (Signed)
Immediate Anesthesia Transfer of Care Note  Patient: Brandy Vasquez  Procedure(s) Performed: COLONOSCOPY WITH PROPOFOL  Patient Location: Endoscopy Unit  Anesthesia Type:General  Level of Consciousness: awake  Airway & Oxygen Therapy: Patient Spontanous Breathing  Post-op Assessment: Report given to RN and Post -op Vital signs reviewed and stable  Post vital signs: Reviewed  Last Vitals:  Vitals Value Taken Time  BP    Temp    Pulse 86 02/14/23 0953  Resp 18 02/14/23 0953  SpO2 98 % 02/14/23 0953  Vitals shown include unvalidated device data.  Last Pain:         Complications: No notable events documented.

## 2023-02-14 NOTE — H&P (Signed)
Arlyss Repress, MD [address]  [address]  [place], [place] [address]  Main: [phone]  Fax: [phone] Pager: [phone]  Primary Care Physician:  Jerrol Banana, MD Primary Gastroenterologist:  Dr. Arlyss Repress  Pre-Procedure History & Physical: HPI:  Brandy Vasquez is a 47 y.o. female is here for an colonoscopy.   Past Medical History:  Diagnosis Date   Dysplasia of cervix, low grade (CIN 1)    GERD (gastroesophageal reflux disease)    Herpes    Hypertension 05/15/2021   on lisinopril/hctz10/12.5 for about a month only   Rheumatoid arthritis     Past Surgical History:  Procedure Laterality Date   BREAST BIOPSY Left 2016   INTRAUTERINE DEVICE (IUD) INSERTION N/A 01/31/2023   Procedure: INTRAUTERINE DEVICE (IUD) INSERTION;  Surgeon: Linzie Collin, MD;  Location: [facility] ORS;  Service: Gynecology;  Laterality: N/A;   IUD REMOVAL N/A 01/31/2023   Procedure: INTRAUTERINE DEVICE (IUD) REMOVAL;  Surgeon: Linzie Collin, MD;  Location: [facility] ORS;  Service: Gynecology;  Laterality: N/A;   LEEP N/A 01/31/2023   Procedure: LOOP ELECTROSURGICAL EXCISION PROCEDURE (LEEP);  Surgeon: Linzie Collin, MD;  Location: [facility] ORS;  Service: Gynecology;  Laterality: N/A;    Prior to Admission medications   Medication Sig Start Date End Date Taking? Authorizing Provider  folic acid (FOLVITE) 1 MG tablet Take 1 mg by mouth daily. 12/24/21  Yes [provider]  gabapentin (NEURONTIN) 100 MG capsule Take 100 mg by mouth 2 (two) times daily as needed. 06/15/22  Yes [provider]  HYDROcodone-acetaminophen (NORCO/VICODIN) 5-325 MG tablet Take 1-2 tablets by mouth every 6 (six) hours as needed for moderate pain. 01/31/23  Yes Linzie Collin, MD  levonorgestrel (MIRENA) 20 MCG/DAY IUD 1 each by Intrauterine route once.   Yes [provider]  methotrexate (RHEUMATREX) 2.5 MG tablet Take 15 mg by mouth once a week. sunday 01/19/22  Yes [provider]  REMICADE 100 MG injection Inject 100 mg into the vein every 8 (eight) weeks. Next dose Feb 23, 2023 07/01/22  Yes [provider]    Allergies as of 01/18/2023 - Review Complete 01/05/2023  Allergen Reaction Noted   Bactrim [sulfamethoxazole-trimethoprim] Rash 12/13/2016    Family History  Problem Relation Age of Onset   Hypertension Mother    Diabetes Mother    Rheum arthritis Mother    Cancer Father    ADD / ADHD Daughter    ADD / ADHD Daughter    Alzheimer's disease Maternal Grandmother    Stroke Maternal Grandfather    Ovarian cancer Paternal Grandmother     Social History   Socioeconomic History   Marital status: Single    Spouse name: Not on file   Number of children: 2   Years of education: 12   Highest education level: High school graduate  Occupational History   Not on file  Tobacco Use   Smoking status: Never   Smokeless tobacco: Never  Vaping Use   Vaping Use: Former   Substances: CBD  Substance and Sexual Activity   Alcohol use: Yes    Alcohol/week: 2.0 standard drinks of alcohol    Types: 2 Standard drinks or equivalent per week   Drug use: Not Currently   Sexual activity: Yes    Partners: Male    Birth control/protection: I.U.D.    Comment: Mirena  Other Topics Concern   Not on file  Social History Narrative   Not on file  Social Determinants of Health   Financial Resource Strain: Not on file  Food Insecurity: No Food Insecurity (07/12/2022)   Hunger Vital Sign    Worried About Running Out of Food in the Last Year: Never true    Ran Out of Food in the Last Year: Never true  Transportation Needs: No Transportation Needs (07/12/2022)   PRAPARE - Administrator, Civil Service (Medical): No    Lack of Transportation (Non-Medical): No  Physical Activity: Not on file  Stress: Not on file  Social Connections: Not on file  Intimate Partner Violence: Not At Risk (07/12/2022)   Humiliation, Afraid, Rape, and Kick questionnaire     Fear of Current or Ex-Partner: No    Emotionally Abused: No    Physically Abused: No    Sexually Abused: No    Review of Systems: See HPI, otherwise negative ROS  Physical Exam: BP (!) 149/105   Pulse 76   Temp (!) 96.2 F (35.7 C) (Temporal)   Resp 18   Ht  (1.702 m)   Wt 83 kg   SpO2 100%   BMI 28.66 kg/m  General:   Alert,  pleasant and cooperative in NAD Head:  Normocephalic and atraumatic. Neck:  Supple; no masses or thyromegaly. Lungs:  Clear throughout to auscultation.    Heart:  Regular rate and rhythm. Abdomen:  Soft, nontender and nondistended. Normal bowel sounds, without guarding, and without rebound.   Neurologic:  Alert and  oriented x4;  grossly normal neurologically.  Impression/Plan: Brandy Vasquez is here for an colonoscopy to be performed for colon cancer screening  Risks, benefits, limitations, and alternatives regarding  colonoscopy have been reviewed with the patient.  Questions have been answered.  All parties agreeable.   Lannette Donath, MD  02/14/2023, 9:22 AM

## 2023-02-14 NOTE — Op Note (Signed)
[facility]enter[facility] Patient Name: Brandy Vasquez Procedure Date: 02/14/2023 9:19 AM MRN: [id number] Account #: [id number] Date of Birth: [birth year] Admit Type: Outpatient Age: 47 Room: [facility] ENDO ROOM 4 Gender: Female Note Status: Finalized Instrument Name: Prentice Docker [id number] Procedure:             Colonoscopy Indications:           Screening for colorectal malignant neoplasm, This is                         the patient's first colonoscopy Providers:             Toney Reil MD, MD Referring MD:          No Local Md, MD (Referring MD) Medicines:             General Anesthesia Complications:         No immediate complications. Estimated blood loss: None. Procedure:             Pre-Anesthesia Assessment:                        - Prior to the procedure, a History and Physical was                         performed, and patient medications and allergies were                         reviewed. The patient is competent. The risks and                         benefits of the procedure and the sedation options and                         risks were discussed with the patient. All questions                         were answered and informed consent was obtained.                         Patient identification and proposed procedure were                         verified by the physician, the nurse, the                         anesthesiologist, the anesthetist and the technician                         in the pre-procedure area in the procedure room in the                         endoscopy suite. Mental Status Examination: alert and                         oriented. Airway Examination: normal oropharyngeal                         airway and neck mobility. Respiratory Examination:  clear to auscultation. CV Examination: normal.                         Prophylactic Antibiotics: The patient does not require                         prophylactic  antibiotics. Prior Anticoagulants: The                         patient has taken no anticoagulant or antiplatelet                         agents. ASA Grade Assessment: II - A patient with mild                         systemic disease. After reviewing the risks and                         benefits, the patient was deemed in satisfactory                         condition to undergo the procedure. The anesthesia                         plan was to use general anesthesia. Immediately prior                         to administration of medications, the patient was                         re-assessed for adequacy to receive sedatives. The                         heart rate, respiratory rate, oxygen saturations,                         blood pressure, adequacy of pulmonary ventilation, and                         response to care were monitored throughout the                         procedure. The physical status of the patient was                         re-assessed after the procedure.                        After obtaining informed consent, the colonoscope was                         passed under direct vision. Throughout the procedure,                         the patient's blood pressure, pulse, and oxygen                         saturations were monitored continuously. The  Colonoscope was introduced through the anus and                         advanced to the the cecum, identified by appendiceal                         orifice and ileocecal valve. The colonoscopy was                         performed without difficulty. The patient tolerated                         the procedure well. The quality of the bowel                         preparation was evaluated using the BBPS [facility] Bowel                         Preparation Scale) with scores of: Right Colon = 3,                         Transverse Colon = 3 and Left Colon = 3 (entire mucosa                         seen  well with no residual staining, small fragments                         of stool or opaque liquid). The total BBPS score                         equals 9. The ileocecal valve, appendiceal orifice,                         and rectum were photographed. Findings:      The perianal and digital rectal examinations were normal. Pertinent       negatives include normal sphincter tone and no palpable rectal lesions.      The entire examined colon appeared normal.      Non-bleeding external hemorrhoids were found during retroflexion. The       hemorrhoids were small. Impression:            - The entire examined colon is normal.                        - Non-bleeding external hemorrhoids.                        - No specimens collected. Recommendation:        - Discharge patient to home (with escort).                        - Resume previous diet today.                        - Continue present medications.                        - Repeat colonoscopy in 10 years for screening  purposes. Procedure Code(s):     --- Professional ---                        Z6109, Colorectal cancer screening; colonoscopy on                         individual not meeting criteria for high risk Diagnosis Code(s):     --- Professional ---                        Z12.11, Encounter for screening for malignant neoplasm                         of colon                        K64.4, Residual hemorrhoidal skin tags CPT copyright 2022 American Medical Association. All rights reserved. The codes documented in this report are preliminary and upon coder review may  be revised to meet current compliance requirements. Dr. Libby Maw Toney Reil MD, MD 02/14/2023 9:52:23 AM This report has been signed electronically. Number of Addenda: 0 Note Initiated On: 02/14/2023 9:19 AM Scope Withdrawal Time: 0 hours 10 minutes 7 seconds  Total Procedure Duration: 0 hours 13 minutes 33 seconds  Estimated Blood  Loss:  Estimated blood loss: none.      [facility]

## 2023-02-14 NOTE — Anesthesia Postprocedure Evaluation (Signed)
Anesthesia Post Note  Patient: Brandy Vasquez  Procedure(s) Performed: COLONOSCOPY WITH PROPOFOL  Patient location during evaluation: Endoscopy Anesthesia Type: General Level of consciousness: awake and alert Pain management: pain level controlled Vital Signs Assessment: post-procedure vital signs reviewed and stable Respiratory status: spontaneous breathing, nonlabored ventilation, respiratory function stable and patient connected to nasal cannula oxygen Cardiovascular status: blood pressure returned to baseline and stable Postop Assessment: no apparent nausea or vomiting Anesthetic complications: no  No notable events documented.   Last Vitals:  Vitals:   02/14/23 1003 02/14/23 1013  BP: (!) 110/97 115/88  Pulse: 73 82  Resp: 20 20  Temp:    SpO2: 94% 100%    Last Pain:  Vitals:   02/14/23 1013  TempSrc:   PainSc: 0-No pain                 Stephanie Coup

## 2023-02-16 ENCOUNTER — Encounter: Payer: Self-pay | Admitting: Gastroenterology

## 2023-02-19 ENCOUNTER — Other Ambulatory Visit: Payer: Self-pay

## 2023-02-19 ENCOUNTER — Emergency Department: Payer: BC Managed Care – PPO

## 2023-02-19 ENCOUNTER — Emergency Department
Admission: EM | Admit: 2023-02-19 | Discharge: 2023-02-19 | Disposition: A | Discharge status: Home / Self Care | Payer: BC Managed Care – PPO | Attending: Emergency Medicine | Admitting: Emergency Medicine

## 2023-02-19 DIAGNOSIS — R42 Dizziness and giddiness: Secondary | ICD-10-CM | POA: Diagnosis not present

## 2023-02-19 DIAGNOSIS — I10 Essential (primary) hypertension: Secondary | ICD-10-CM | POA: Insufficient documentation

## 2023-02-19 DIAGNOSIS — N939 Abnormal uterine and vaginal bleeding, unspecified: Secondary | ICD-10-CM | POA: Diagnosis not present

## 2023-02-19 LAB — BASIC METABOLIC PANEL
Anion gap: 5 (ref 5–15)
BUN: 12 mg/dL (ref 6–20)
CO2: 25 mmol/L (ref 22–32)
Calcium: 8.4 mg/dL — ABNORMAL LOW (ref 8.9–10.3)
Chloride: 111 mmol/L (ref 98–111)
Creatinine, Ser: 0.82 mg/dL (ref 0.44–1.00)
GFR, Estimated: >60 mL/min (ref >60)
Glucose, Bld: 76 mg/dL (ref 70–99)
Potassium: 3.5 mmol/L (ref 3.5–5.1)
Sodium: 141 mmol/L (ref 135–145)

## 2023-02-19 LAB — URINALYSIS, ROUTINE W REFLEX MICROSCOPIC
Bacteria, UA: NONE SEEN
RBC / HPF: >50 RBC/hpf (ref 0–5)
Specific Gravity, Urine: 1.031 — ABNORMAL HIGH (ref 1.005–1.030)

## 2023-02-19 LAB — CBC
HCT: 38.1 % (ref 36.0–46.0)
Hemoglobin: 12.5 g/dL (ref 12.0–15.0)
MCH: 32.8 pg (ref 26.0–34.0)
MCHC: 32.8 g/dL (ref 30.0–36.0)
MCV: 100 fL (ref 80.0–100.0)
Platelets: 280 10*3/uL (ref 150–400)
RBC: 3.81 MIL/uL — ABNORMAL LOW (ref 3.87–5.11)
RDW: 12.5 % (ref 11.5–15.5)
WBC: 6.2 10*3/uL (ref 4.0–10.5)
nRBC: 0 % (ref 0.0–0.2)

## 2023-02-19 LAB — PREGNANCY, URINE: Preg Test, Ur: NEGATIVE

## 2023-02-19 NOTE — ED Triage Notes (Signed)
Pt to ED POV for intermittent dizziness since 4 days ago and vaginal bleeding after LEEP procedure since 01/31/23. Denies clots. Blood is dark red in color. Pt ambulatory. Changing pads infrequently.

## 2023-02-19 NOTE — ED Provider Notes (Signed)
[facility] Provider Note    Event Date/Time   First MD Initiated Contact with Patient 02/19/23 1215     (approximate)  History   Chief Complaint: Dizziness and Vaginal Bleeding  HPI  Brandy Vasquez is a 47 y.o. female with a past medical history of cervical dysplasia status post LEEP/8/24, gastric reflux, hypertension, routine colonoscopy 02/14/2023 presents to the emergency department for vaginal bleeding and dizziness.  According to the patient on 02/14/2023 she had a colonoscopy, routine as she just turned 47.  Patient states starting 02/15/2023 she states she has been feeling somewhat dizzy which she describes as lightheadedness intermittently.  Patient also states today she started with some vaginal bleeding states she has not had to change her tampon/pad as of yet, but because she recently had a LEEP procedure she was concerned about this as well so she came to the emergency department for evaluation.  Patient denies any abdominal pain.  No fever.  Physical Exam   Triage Vital Signs: ED Triage Vitals  Enc Vitals Group     BP 02/19/23 1206 (!) 130/90     Pulse Rate 02/19/23 1206 75     Resp 02/19/23 1206 16     Temp 02/19/23 1206 98.2 F (36.8 C)     Temp Source 02/19/23 1206 Oral     SpO2 02/19/23 1206 99 %     Weight 02/19/23 1205 185 lb (83.9 kg)     Height 02/19/23 1205 5\' 6"  (1.676 m)     Head Circumference --      Peak Flow --      Pain Score 02/19/23 1205 0     Pain Loc --      Pain Edu? --      Excl. in GC? --     Most recent vital signs: Vitals:   02/19/23 1206  BP: (!) 130/90  Pulse: 75  Resp: 16  Temp: 98.2 F (36.8 C)  SpO2: 99%    General: Awake, no distress.  CV:  Good peripheral perfusion.  Regular rate and rhythm  Resp:  Normal effort.  Equal breath sounds bilaterally.  Abd:  No distention.  Soft, nontender.  No rebound or guarding.  ED Results / Procedures / Treatments   EKG  EKG viewed and interpreted by myself  shows a normal sinus rhythm at 71 bpm with a narrow QRS, normal axis, normal intervals, no concerning ST changes.  RADIOLOGY  Ultrasound shows no torsion, 3.2 simple/benign appearing left ovarian cyst.  IUD in the endometrial canal no concerning findings of the endometrium.   MEDICATIONS ORDERED IN ED: Medications - No data to display   IMPRESSION / MDM / ASSESSMENT AND PLAN / ED COURSE  I reviewed the triage vital signs and the nursing notes.  Patient's presentation is most consistent with acute presentation with potential threat to life or bodily function.  Patient presents emergency department for vaginal bleeding and lightheadedness.  Patient states lightheadedness occurred the day after the colonoscopy and has been intermittent since then.  Could very likely be more anesthesia related dizziness.  Reassuringly patient's lab work shows a normal CBC with a reassuring chemistry urinalysis shows greater than 50 RBCs but no bacteria or white blood cells.  Urine pregnancy test has been added on to the urine sample.  We will also obtain a pelvic ultrasound to further evaluate.  Patient is agreeable to plan of care.  Patient's workup is reassuring, CBC is normal, chemistry reassuring, hematuria on her  urinalysis but no sign of infection pregnancy test is negative ultrasound reassuring.  Given the patient's reassuring workup I believe the patient could be safely discharged home with outpatient follow-up with her PCP.  Patient agreeable to plan of care.  FINAL CLINICAL IMPRESSION(S) / ED DIAGNOSES   General bleeding Dizziness   Note:  This document was prepared using Dragon voice recognition software and may include unintentional dictation errors.   Minna Antis, MD 02/19/23 1553

## 2023-02-19 NOTE — ED Notes (Signed)
Pt verbalizes understanding of discharge instructions. Opportunity for questioning and answers were provided. Pt discharged from ED to home. Pt states that the dizziness is better when she is laying down.

## 2023-02-21 ENCOUNTER — Encounter: Payer: Self-pay | Admitting: Family Medicine

## 2023-02-23 DIAGNOSIS — M0579 Rheumatoid arthritis with rheumatoid factor of multiple sites without organ or systems involvement: Secondary | ICD-10-CM | POA: Diagnosis not present

## 2023-02-25 ENCOUNTER — Ambulatory Visit: Payer: BC Managed Care – PPO | Admitting: Family Medicine

## 2023-02-25 VITALS — BP 122/74 | HR 74 | Ht 67.0 in | Wt 190.0 lb

## 2023-02-25 DIAGNOSIS — R42 Dizziness and giddiness: Secondary | ICD-10-CM | POA: Insufficient documentation

## 2023-02-25 DIAGNOSIS — E559 Vitamin D deficiency, unspecified: Secondary | ICD-10-CM | POA: Diagnosis not present

## 2023-02-25 DIAGNOSIS — R7989 Other specified abnormal findings of blood chemistry: Secondary | ICD-10-CM

## 2023-02-25 MED ORDER — MECLIZINE HCL 25 MG PO TABS: 25.0000 mg | ORAL_TABLET | Freq: Three times a day (TID) | ORAL | 0 refills | Status: DC | PRN

## 2023-02-25 NOTE — Progress Notes (Signed)
     Primary Care / Sports Medicine Office Visit  Patient Information:  Patient ID: Brandy Vasquez, female DOB: 1975-11-16 Age: 47 y.o. MRN: [id number]   Brandy Vasquez is a pleasant 47 y.o. female presenting with the following:  Chief Complaint  Patient presents with   Dizziness    1 day after colonoscopy, went to ER all labs and scans were normal    Vitals:   02/25/23 1005  BP: 122/74  Pulse: 74  SpO2: 99%   Vitals:   02/25/23 1005  Weight: 190 lb (86.2 kg)  Height: 5\' 7"  (1.702 m)   Body mass index is 29.76 kg/m.     Independent interpretation of notes and tests performed by another provider:   None  Procedures performed:   None  Pertinent History, Exam, Impression, and Recommendations:   Brandy Vasquez was seen today for dizziness.  Vertigo Assessment & Plan: Chronic condition most recently 6 noted to be symptomatic since 4/23, did happen to have 2 prior procedures (LEEP on 4/8 and colonoscopy on 4/22), the former of which involving more sedation for which she was asymptomatic afterwards.  Her past history does involve treatment with meclizine as needed.  Additional factors to consider include patient wearing glasses, has upcoming optometrist visit to verify/update prescription.  Describes room spinning to degree, postural component (sitting to standing), no overt syncope.  Cranial nerves grossly intact, Dix-Hallpike maneuver negative, cardiopulmonary findings benign.  Plan as follows: - Obtain anemia workup given recent CBC demonstrating borderline macrocytosis - Empirically treat with meclizine as needed - Will follow results and note response for meclizine to dictate next steps which could include referral to neurology  Orders: -     B12 and Folate Panel -     Iron, TIBC and Ferritin Panel  Abnormal CBC -     B12 and Folate Panel -     Iron, TIBC and Ferritin Panel  Other orders -     Meclizine HCl; Take 1 tablet (25 mg total) by mouth 3 (three) times daily as  needed for dizziness.  Dispense: 30 tablet; Refill: 0     Orders & Medications Meds ordered this encounter  Medications   meclizine (ANTIVERT) 25 MG tablet    Sig: Take 1 tablet (25 mg total) by mouth 3 (three) times daily as needed for dizziness.    Dispense:  30 tablet    Refill:  0   Orders Placed This Encounter  Procedures   B12 and Folate Panel   Iron, TIBC and Ferritin Panel     No follow-ups on file.     Jerrol Banana, MD, [facility]   Primary Care Sports Medicine Primary Care and Sports Medicine at [facility]

## 2023-02-25 NOTE — Assessment & Plan Note (Addendum)
Chronic condition most recently 6 noted to be symptomatic since 4/23, did happen to have 2 prior procedures (LEEP on 4/8 and colonoscopy on 4/22), the former of which involving more sedation for which she was asymptomatic afterwards.  Her past history does involve treatment with meclizine as needed.  Additional factors to consider include patient wearing glasses, has upcoming optometrist visit to verify/update prescription.  Describes room spinning to degree, postural component (sitting to standing), no overt syncope.  Cranial nerves grossly intact, Dix-Hallpike maneuver negative, cardiopulmonary findings benign.  Plan as follows: - Obtain anemia workup given recent CBC demonstrating borderline macrocytosis - Empirically treat with meclizine as needed - Will follow results and note response for meclizine to dictate next steps which could include referral to neurology

## 2023-02-25 NOTE — Patient Instructions (Signed)
-   Obtain blood work today - Can dose meclizine as needed, follow Rx instructions - Will contact you with results, contact us for any question/concerns or change in symptoms

## 2023-02-26 LAB — IRON,TIBC AND FERRITIN PANEL
Ferritin: 107 ng/mL (ref 15–150)
Iron Saturation: 16 % (ref 15–55)
Iron: 40 ug/dL (ref 27–159)
Total Iron Binding Capacity: 247 ug/dL — ABNORMAL LOW (ref 250–450)
UIBC: 207 ug/dL (ref 131–425)

## 2023-02-26 LAB — B12 AND FOLATE PANEL
Folate: 4 ng/mL (ref >3.0)
Vitamin B-12: 647 pg/mL (ref 232–1245)

## 2023-02-28 DIAGNOSIS — G8929 Other chronic pain: Secondary | ICD-10-CM | POA: Diagnosis not present

## 2023-02-28 DIAGNOSIS — M25552 Pain in left hip: Secondary | ICD-10-CM | POA: Diagnosis not present

## 2023-02-28 DIAGNOSIS — M7062 Trochanteric bursitis, left hip: Secondary | ICD-10-CM | POA: Diagnosis not present

## 2023-03-01 ENCOUNTER — Ambulatory Visit (INDEPENDENT_AMBULATORY_CARE_PROVIDER_SITE_OTHER): Payer: BC Managed Care – PPO | Admitting: Obstetrics and Gynecology

## 2023-03-01 ENCOUNTER — Encounter: Payer: Self-pay | Admitting: Obstetrics and Gynecology

## 2023-03-01 VITALS — BP 123/84 | HR 71 | Wt 189.4 lb

## 2023-03-01 DIAGNOSIS — N870 Mild cervical dysplasia: Secondary | ICD-10-CM

## 2023-03-01 DIAGNOSIS — Z9889 Other specified postprocedural states: Secondary | ICD-10-CM

## 2023-03-01 DIAGNOSIS — Z4889 Encounter for other specified surgical aftercare: Secondary | ICD-10-CM

## 2023-03-01 DIAGNOSIS — Z30431 Encounter for routine checking of intrauterine contraceptive device: Secondary | ICD-10-CM

## 2023-03-01 NOTE — Progress Notes (Signed)
HPI:      [name] is a 47 y.o. [id number] who LMP was No LMP recorded. (Menstrual status: IUD).  Subjective:   She presents today 1 month after LEEP and IUD insertion.  She states that she has bled on and off during this month.  Sometimes passing small clots.  She had an H&H performed which was normal.  She reports that she has not checked her strings and has not resumed intercourse.    Hx: The following portions of the patient's history were reviewed and updated as appropriate:             She  has a past medical history of Dysplasia of cervix, low grade (CIN 1), GERD (gastroesophageal reflux disease), Herpes, Hypertension (05/15/2021), and Rheumatoid arthritis (HCC). She does not have any pertinent problems on file. She  has a past surgical history that includes Breast biopsy (Left, 2016); LEEP (N/A, 01/31/2023); IUD removal (N/A, 01/31/2023); Intrauterine device (iud) insertion (N/A, 01/31/2023); and Colonoscopy with propofol (N/A, 02/14/2023). Her family history includes ADD / ADHD in her daughter and daughter; Alzheimer's disease in her maternal grandmother; Cancer in her father; Diabetes in her mother; Hypertension in her mother; Ovarian cancer in her paternal grandmother; Rheum arthritis in her mother; Stroke in her maternal grandfather. She  reports that she has never smoked. She has never used smokeless tobacco. She reports current alcohol use of about 2.0 standard drinks of alcohol per week. She reports that she does not currently use drugs. She has a current medication list which includes the following prescription(s): folic acid, gabapentin, levonorgestrel, meclizine, methotrexate, and remicade. She is allergic to bactrim [sulfamethoxazole-trimethoprim].       Review of Systems:  Review of Systems  Constitutional: Denied constitutional symptoms, night sweats, recent illness, fatigue, fever, insomnia and weight loss.  Eyes: Denied eye symptoms, eye pain, photophobia, vision change and  visual disturbance.  Ears/Nose/Throat/Neck: Denied ear, nose, throat or neck symptoms, hearing loss, nasal discharge, sinus congestion and sore throat.  Cardiovascular: Denied cardiovascular symptoms, arrhythmia, chest pain/pressure, edema, exercise intolerance, orthopnea and palpitations.  Respiratory: Denied pulmonary symptoms, asthma, pleuritic pain, productive sputum, cough, dyspnea and wheezing.  Gastrointestinal: Denied, gastro-esophageal reflux, melena, nausea and vomiting.  Genitourinary: See HPI for additional information.  Musculoskeletal: Denied musculoskeletal symptoms, stiffness, swelling, muscle weakness and myalgia.  Dermatologic: Denied dermatology symptoms, rash and scar.  Neurologic: Denied neurology symptoms, dizziness, headache, neck pain and syncope.  Psychiatric: Denied psychiatric symptoms, anxiety and depression.  Endocrine: Denied endocrine symptoms including hot flashes and night sweats.   Meds:   Current Outpatient Medications on File Prior to Visit  Medication Sig Dispense Refill   folic acid (FOLVITE) 1 MG tablet Take 1 mg by mouth daily.     gabapentin (NEURONTIN) 100 MG capsule Take 100 mg by mouth 2 (two) times daily as needed.     levonorgestrel (MIRENA) 20 MCG/DAY IUD 1 each by Intrauterine route once.     meclizine (ANTIVERT) 25 MG tablet Take 1 tablet (25 mg total) by mouth 3 (three) times daily as needed for dizziness. 30 tablet 0   methotrexate (RHEUMATREX) 2.5 MG tablet Take 15 mg by mouth once a week. sunday     REMICADE 100 MG injection Inject 100 mg into the vein every 8 (eight) weeks. Next dose Feb 23, 2023     No current facility-administered medications on file prior to visit.      Objective:     Vitals:   03/01/23 1028  BP:  123/84  Pulse: 71   Filed Weights   03/01/23 1028  Weight: 189 lb 6.4 oz (85.9 kg)              Physical examination   Pelvic:   Vulva: Normal appearance.  No lesions.  Vagina: No lesions or abnormalities  noted.  Support: Normal pelvic support.  Urethra No masses tenderness or scarring.  Meatus Normal size without lesions or prolapse.  Cervix: Obviously status post procedure. IUD strings noted at cervical os.  Anus: Normal exam.  No lesions.  Perineum: Normal exam.  No lesions.             Assessment:    [id number] Patient Active Problem List   Diagnosis Date Noted   Vertigo 02/25/2023   Screening for colon cancer 02/14/2023   Chronic idiopathic constipation 10/12/2022   Malodorous urine 08/09/2022   Encounter for weight management 08/09/2022   Arthralgia of left hip 07/12/2022   Breast pain, left 02/05/2022   Leg cramps 01/19/2022   Class 1 obesity with serious comorbidity and body mass index (BMI) of 31.0 to 31.9 in adult 01/19/2022   Long-term use of immunosuppressant medication 12/24/2021   Rheumatoid arthritis involving multiple sites with positive rheumatoid factor (HCC) 12/24/2021   Injury of triangular fibrocartilage complex of left wrist 12/08/2021   Cyclic citrullinated peptide (CCP) antibody positive 10/29/2021   Cervical spondylosis with radiculopathy 10/07/2021   Gastroesophageal reflux disease without esophagitis 10/07/2021   Furuncle 08/26/2021   Cystitis 08/04/2021   Cervical paraspinal muscle spasm 08/04/2021   Metabolic syndrome 05/27/2021   Rotator cuff arthropathy of left shoulder 05/27/2021   Primary hypertension 05/15/2021   Annual physical exam 04/03/2021   Fungal infection of skin 04/03/2021   Elevated blood pressure reading 04/03/2021   Dysplasia of cervix, low grade (CIN 1) 08/31/2018     1. Postoperative state   2. Encounter for routine checking of intrauterine contraceptive device (IUD)   3. CIN I (cervical intraepithelial neoplasia I)     CIN-1 of the endocervical canal.  Status post LEEP  Intermittent bleeding over the last month with normal H&H.  This is likely an effect of the LEEP and her IUD in combination.   Plan:            1.   Expect that her bleeding will resolve over the next 2 weeks.  I have asked her to refrain from intercourse for 2 more weeks because of the apparent fragility of the base of the LEEP noted on exam today.  2.  Will plan follow-up first Pap after LEEP in 5 months  3.  If patient's bleeding does not resolve in the next 2 to 3 weeks she will contact us via MyChart. Orders No orders of the defined types were placed in this encounter.   No orders of the defined types were placed in this encounter.     F/U  Return in about 5 months (around 08/01/2023).  Elonda Husky, M.D. 03/01/2023 10:57 AM

## 2023-03-02 ENCOUNTER — Encounter: Payer: Self-pay | Admitting: Family Medicine

## 2023-03-02 NOTE — Telephone Encounter (Signed)
Pt response.  KP

## 2023-03-02 NOTE — Telephone Encounter (Signed)
Please advise 

## 2023-04-20 DIAGNOSIS — M0579 Rheumatoid arthritis with rheumatoid factor of multiple sites without organ or systems involvement: Secondary | ICD-10-CM | POA: Diagnosis not present

## 2023-05-17 DIAGNOSIS — M7989 Other specified soft tissue disorders: Secondary | ICD-10-CM | POA: Diagnosis not present

## 2023-05-17 DIAGNOSIS — Z7960 Long term (current) use of unspecified immunomodulators and immunosuppressants: Secondary | ICD-10-CM | POA: Diagnosis not present

## 2023-05-17 DIAGNOSIS — M0579 Rheumatoid arthritis with rheumatoid factor of multiple sites without organ or systems involvement: Secondary | ICD-10-CM | POA: Diagnosis not present

## 2023-06-15 DIAGNOSIS — M0579 Rheumatoid arthritis with rheumatoid factor of multiple sites without organ or systems involvement: Secondary | ICD-10-CM | POA: Diagnosis not present

## 2023-06-17 ENCOUNTER — Ambulatory Visit: Payer: BC Managed Care – PPO | Admitting: Licensed Practical Nurse

## 2023-06-17 VITALS — BP 143/104 | HR 76 | Wt 202.4 lb

## 2023-06-17 DIAGNOSIS — L819 Disorder of pigmentation, unspecified: Secondary | ICD-10-CM

## 2023-06-17 DIAGNOSIS — N644 Mastodynia: Secondary | ICD-10-CM | POA: Diagnosis not present

## 2023-06-17 DIAGNOSIS — R030 Elevated blood-pressure reading, without diagnosis of hypertension: Secondary | ICD-10-CM

## 2023-06-17 NOTE — Progress Notes (Signed)
  HPI:      [name] is a 47 y.o. [id number] who is has menstrual cycles, presents today for a problem visit.  She complains of  dark spot on her breast x 2 weeks and bilateral breast pain.    The dark spot is under her right breast, she thought it could be a bug bite, it initially was darker, she applied neosporin on it. It seems to have improved but it is still there. Denies any fevers  Breast Pain:Brandy Vasquez has always had breast pain at the start of her cycles. But over the last 2 months the pain has gotten worse, it seems to always be there. She wears a well fitting  underwire bra during the day and a sports bra to sleep. She has less discomfort while wearing the underwire. She has more pain when not wearing any bra. She has pain whenever they are touched. She has tried Tylenol and Motrin, it does not help. She does not consume caffeine. She has a Mirena IUD, which was recently replaced.   Denies hx of CHTN, she works overnight and has just come from work.   Prior Mammogram: had a mammo in the past d/t a lump, no concerns  Prior breast problems: Yes lump in left breast  Family History: Breast Cancer-related family history is not on file.  PMHx: She  has a past medical history of Dysplasia of cervix, low grade (CIN 1), GERD (gastroesophageal reflux disease), Herpes, Hypertension (05/15/2021), and Rheumatoid arthritis (HCC). Also,  has a past surgical history that includes Breast biopsy (Left, 2016); LEEP (N/A, 01/31/2023); IUD removal (N/A, 01/31/2023); Intrauterine device (iud) insertion (N/A, 01/31/2023); and Colonoscopy with propofol (N/A, 02/14/2023)., family history includes ADD / ADHD in her daughter and daughter; Alzheimer's disease in her maternal grandmother; Cancer in her father; Diabetes in her mother; Hypertension in her mother; Ovarian cancer in her paternal grandmother; Rheum arthritis in her mother; Stroke in her maternal grandfather.,  reports that she has never smoked. She has never used  smokeless tobacco. She reports current alcohol use of about 2.0 standard drinks of alcohol per week. She reports that she does not currently use drugs.  She has a current medication list which includes the following prescription(s): folic acid, gabapentin, levonorgestrel, meclizine, methotrexate, and remicade. Also, is allergic to bactrim [sulfamethoxazole-trimethoprim].  ROS see HPI   Objective: BP (!) 143/104   Pulse 76   Wt 202 lb 6.4 oz (91.8 kg)   BMI 31.70 kg/m  Physical Exam Constitutional:      Appearance: Normal appearance.  Chest:     Comments: Right breast: 1-30mm area of discoloration on lower outer breast, area flat no redness or swelling, there appears to a center opening. Possible bug bite or lesion that appears to be healing. Otherwise no masses. Tenderness over most the breast with palpation.  Left breast: no masses or discoloration. Tender with exam over most of breast.  Neurological:     Mental Status: She is alert.     ASSESSMENT/PLAN: breast pain  Lesion on right breast  -May try warm compress to area on right breast. If discoloration persists or worsens consider seeing derm  -Breast pain Handout given  -Today's symptoms are not concerning for Breast Cancer, encouraged pt to keep scheduled Mammogram in November  -elevated BP: recommend follow up with Dr Ashley Royalty for BP check   Carie Caddy, CNM  [facility]  06/17/23  12:48 PM

## 2023-08-10 DIAGNOSIS — M0579 Rheumatoid arthritis with rheumatoid factor of multiple sites without organ or systems involvement: Secondary | ICD-10-CM | POA: Diagnosis not present

## 2023-08-11 ENCOUNTER — Encounter: Payer: Self-pay | Admitting: Family Medicine

## 2023-08-15 ENCOUNTER — Ambulatory Visit: Payer: BC Managed Care – PPO | Admitting: Family Medicine

## 2023-08-18 ENCOUNTER — Encounter: Payer: Self-pay | Admitting: Obstetrics and Gynecology

## 2023-09-05 ENCOUNTER — Encounter (HOSPITAL_BASED_OUTPATIENT_CLINIC_OR_DEPARTMENT_OTHER): Payer: Self-pay | Admitting: Radiology

## 2023-09-05 ENCOUNTER — Ambulatory Visit (HOSPITAL_BASED_OUTPATIENT_CLINIC_OR_DEPARTMENT_OTHER)
Admission: RE | Admit: 2023-09-05 | Discharge: 2023-09-05 | Disposition: A | Discharge status: Home / Self Care | Payer: BC Managed Care – PPO | Source: Ambulatory Visit | Attending: Family Medicine | Admitting: Family Medicine

## 2023-09-05 DIAGNOSIS — Z1231 Encounter for screening mammogram for malignant neoplasm of breast: Secondary | ICD-10-CM | POA: Diagnosis not present

## 2023-09-05 DIAGNOSIS — R92323 Mammographic fibroglandular density, bilateral breasts: Secondary | ICD-10-CM | POA: Insufficient documentation

## 2023-09-20 DIAGNOSIS — M0579 Rheumatoid arthritis with rheumatoid factor of multiple sites without organ or systems involvement: Secondary | ICD-10-CM | POA: Diagnosis not present

## 2023-09-20 DIAGNOSIS — M79671 Pain in right foot: Secondary | ICD-10-CM | POA: Diagnosis not present

## 2023-09-20 DIAGNOSIS — Z7960 Long term (current) use of unspecified immunomodulators and immunosuppressants: Secondary | ICD-10-CM | POA: Diagnosis not present

## 2023-10-05 DIAGNOSIS — M0579 Rheumatoid arthritis with rheumatoid factor of multiple sites without organ or systems involvement: Secondary | ICD-10-CM | POA: Diagnosis not present

## 2023-10-12 DIAGNOSIS — M7671 Peroneal tendinitis, right leg: Secondary | ICD-10-CM | POA: Diagnosis not present

## 2023-10-12 DIAGNOSIS — M79671 Pain in right foot: Secondary | ICD-10-CM | POA: Diagnosis not present

## 2023-10-12 DIAGNOSIS — M7731 Calcaneal spur, right foot: Secondary | ICD-10-CM | POA: Diagnosis not present

## 2023-10-12 DIAGNOSIS — M722 Plantar fascial fibromatosis: Secondary | ICD-10-CM | POA: Diagnosis not present

## 2023-10-13 ENCOUNTER — Encounter: Payer: Self-pay | Admitting: Family Medicine

## 2023-10-13 NOTE — Telephone Encounter (Signed)
Please review.  KP

## 2023-10-14 ENCOUNTER — Other Ambulatory Visit: Payer: Self-pay | Admitting: Family Medicine

## 2023-10-14 DIAGNOSIS — L988 Other specified disorders of the skin and subcutaneous tissue: Secondary | ICD-10-CM

## 2023-10-14 NOTE — Telephone Encounter (Signed)
Please preview.  KP

## 2023-12-01 DIAGNOSIS — M0579 Rheumatoid arthritis with rheumatoid factor of multiple sites without organ or systems involvement: Secondary | ICD-10-CM | POA: Diagnosis not present

## 2023-12-23 ENCOUNTER — Telehealth: Payer: Self-pay | Admitting: Family Medicine

## 2023-12-23 NOTE — Telephone Encounter (Signed)
 Pt was transferred to [facility] by operator. Pt states she missed a call from the main number but was not sure who called. Pt states called did not LVM. Pt is not a [facility] pt and no notes found to advise who had called her.

## 2023-12-28 DIAGNOSIS — L309 Dermatitis, unspecified: Secondary | ICD-10-CM | POA: Diagnosis not present

## 2023-12-28 DIAGNOSIS — L810 Postinflammatory hyperpigmentation: Secondary | ICD-10-CM | POA: Diagnosis not present

## 2024-01-13 ENCOUNTER — Encounter: Payer: Self-pay | Admitting: Family Medicine

## 2024-01-16 ENCOUNTER — Ambulatory Visit: Admitting: Family Medicine

## 2024-01-16 VITALS — BP 110/80 | HR 92 | Ht 67.0 in | Wt 210.0 lb

## 2024-01-16 DIAGNOSIS — R42 Dizziness and giddiness: Secondary | ICD-10-CM

## 2024-01-16 MED ORDER — MECLIZINE HCL 25 MG PO TABS: 25.0000 mg | ORAL_TABLET | Freq: Three times a day (TID) | ORAL | 0 refills | Status: DC | PRN

## 2024-01-17 ENCOUNTER — Other Ambulatory Visit: Payer: Self-pay | Admitting: Family Medicine

## 2024-01-17 DIAGNOSIS — G43801 Other migraine, not intractable, with status migrainosus: Secondary | ICD-10-CM

## 2024-01-17 MED ORDER — NURTEC 75 MG PO TBDP: 1.0000 | ORAL_TABLET | Freq: Every day | ORAL | 0 refills | Status: AC | PRN

## 2024-01-17 NOTE — Telephone Encounter (Signed)
 Please review patient message.   JM

## 2024-01-18 NOTE — Patient Instructions (Signed)
 Patient Care Plan  1. Take Nurtec for dizziness. If symptoms persist, take another dose in 24 hours. 2. Use meclizine if Nurtec does not relieve symptoms. 3. Report back on how well the medications are working. 4. Attend the neurology appointment for further evaluation of migraine and dizziness. 5. Schedule a routine annual physical in two months, including blood work.  Red Flags  - Contact your healthcare provider if dizziness worsens, new symptoms appear, or if you experience severe side effects from medications.

## 2024-01-18 NOTE — Progress Notes (Signed)
 Primary Care / Sports Medicine Office Visit  Patient Information:  Patient ID: Brandy Vasquez, female DOB: Feb 20, 1976 Age: 48 y.o. MRN: [id number]   Brandy Vasquez is a pleasant 48 y.o. female presenting with the following:  Chief Complaint  Patient presents with   Dizziness    Patient having dizzy spells that began on 01/10/24. Patient has had episodes where she has been walking and feel herself get off balance. She has also felt this way when she has been sitting. She hasn't lost vision but she thinks she may have seen "floaters". She was having heart flutters but since she stopped taking meloxicam this has not happened again.     Vitals:   01/16/24 1555  BP: 110/80  Pulse: 92  SpO2: 98%   Vitals:   01/16/24 1555  Weight: 210 lb (95.3 kg)  Height: 5\' 7"  (1.702 m)   Body mass index is 32.89 kg/m.  No results found.   Independent interpretation of notes and tests performed by another provider:   None  Procedures performed:   None  Pertinent History, Exam, Impression, and Recommendations:   Problem List Items Addressed This Visit     Dizziness - Primary   History of Present Illness Brandy Vasquez is a 48 year old female who presents with dizziness and associated visual disturbances.  She has been experiencing dizziness since May of last year, with episodes occurring intermittently. The dizziness is not associated with positional changes and can occur while laying down, sitting, or walking. Recently, she experienced a three-day exacerbation of dizziness without identifiable triggers or changes in her routine. She has a history of dizziness previously evaluated as vertigo and was prescribed meclizine, which she has not consistently used since May of last year due to the intermittent nature of her symptoms.  She cannot recall if these medications provided relief.  She reports mild headaches accompanying the dizziness but denies severe headaches, overt vision changes, or  blurred vision. However, she describes seeing 'spots' or floaters across her pupils, which move with her gaze.  Denies any specific preceding aura, showed images of these to the patient for confirmation.  Her medication history includes the use of meloxicam for heel spurs, which she discontinued after experiencing chest flutters. The flutters resolved after stopping the medication. She received a cortisone shot for her heel spurs, which effectively managed the pain.  Her blood pressure readings were elevated at 140/87 and 140/80 reported by patient from outside readings on consecutive days but have since normalized to 110/80, as noted in office today. No new exposures, dietary changes, or stressors leading up to the recent dizziness episode.  Physical Exam VITALS: BP- 110/80 NEUROVASCULAR: Cranial nerves II-XII grossly intact without focal deficit. Finger to nose coordination normal. Heel to toe coordination intact. Dysdiadochokinesia testing benign. CHEST: Lungs clear to auscultation bilaterally, no wheezes, rales, or rhonchi. CARDIOVASCULAR: Heart S1, S2, regular rate and rhythm, no additional sounds.  Assessment and Plan Dizziness Intermittent dizziness with mild headache and visual disturbances. Suspected ocular variant migraine due to visual symptoms and lack of positional correlation. Differential includes BPPV. - Prescribed Nurtec for suspected migraine. Instructed to take one dose and repeat in 24 hours if symptoms persist. - Prescribed meclizine for potential BPPV. Advised to use if Nurtec is ineffective. - Referred to neurology for further evaluation of migraine and dizziness. - Instructed to report back on medication effectiveness.  Hypertension Blood pressure well-controlled at 110/80. No treatment required.  Follow-up - Schedule routine annual physical  in two months, including blood work. - Follow up with neurology as scheduled.        Orders & Medications Medications:   Meds ordered this encounter  Medications   meclizine (ANTIVERT) 25 MG tablet    Sig: Take 1 tablet (25 mg total) by mouth 3 (three) times daily as needed for dizziness.    Dispense:  30 tablet    Refill:  0   No orders of the defined types were placed in this encounter.    Return in about 2 months (around 03/17/2024) for CPE.     Jerrol Banana, MD, [facility]   Primary Care Sports Medicine Primary Care and Sports Medicine at [facility]

## 2024-01-18 NOTE — Assessment & Plan Note (Addendum)
 History of Present Illness Brandy Vasquez is a 48 year old female who presents with dizziness and associated visual disturbances.  She has been experiencing dizziness since May of last year, with episodes occurring intermittently. The dizziness is not associated with positional changes and can occur while laying down, sitting, or walking. Recently, she experienced a three-day exacerbation of dizziness without identifiable triggers or changes in her routine. She has a history of dizziness previously evaluated as vertigo and was prescribed meclizine, which she has not consistently used since May of last year due to the intermittent nature of her symptoms.  She cannot recall if these medications provided relief.  She reports mild headaches accompanying the dizziness but denies severe headaches, overt vision changes, or blurred vision. However, she describes seeing 'spots' or floaters across her pupils, which move with her gaze.  Denies any specific preceding aura, showed images of these to the patient for confirmation.  Her medication history includes the use of meloxicam for heel spurs, which she discontinued after experiencing chest flutters. The flutters resolved after stopping the medication. She received a cortisone shot for her heel spurs, which effectively managed the pain.  Her blood pressure readings were elevated at 140/87 and 140/80 reported by patient from outside readings on consecutive days but have since normalized to 110/80, as noted in office today. No new exposures, dietary changes, or stressors leading up to the recent dizziness episode.  Physical Exam VITALS: BP- 110/80 NEUROVASCULAR: Cranial nerves II-XII grossly intact without focal deficit. Finger to nose coordination normal. Heel to toe coordination intact. Dysdiadochokinesia testing benign. CHEST: Lungs clear to auscultation bilaterally, no wheezes, rales, or rhonchi. CARDIOVASCULAR: Heart S1, S2, regular rate and rhythm, no  additional sounds.  Assessment and Plan Dizziness Intermittent dizziness with mild headache and visual disturbances. Suspected ocular variant migraine due to visual symptoms and lack of positional correlation. Differential includes BPPV. - Prescribed Nurtec for suspected migraine. Instructed to take one dose and repeat in 24 hours if symptoms persist. - Prescribed meclizine for potential BPPV. Advised to use if Nurtec is ineffective. - Referred to neurology for further evaluation of migraine and dizziness. - Instructed to report back on medication effectiveness.  Hypertension Blood pressure well-controlled at 110/80. No treatment required.  Follow-up - Schedule routine annual physical in two months, including blood work. - Follow up with neurology as scheduled.

## 2024-01-26 DIAGNOSIS — M0579 Rheumatoid arthritis with rheumatoid factor of multiple sites without organ or systems involvement: Secondary | ICD-10-CM | POA: Diagnosis not present

## 2024-02-02 DIAGNOSIS — M0579 Rheumatoid arthritis with rheumatoid factor of multiple sites without organ or systems involvement: Secondary | ICD-10-CM | POA: Diagnosis not present

## 2024-02-02 DIAGNOSIS — Z7960 Long term (current) use of unspecified immunomodulators and immunosuppressants: Secondary | ICD-10-CM | POA: Diagnosis not present

## 2024-02-29 ENCOUNTER — Encounter: Payer: Self-pay | Admitting: Family Medicine

## 2024-03-13 ENCOUNTER — Ambulatory Visit: Admitting: Family Medicine

## 2024-03-13 ENCOUNTER — Encounter: Payer: Self-pay | Admitting: Family Medicine

## 2024-03-13 VITALS — BP 120/78 | HR 81 | Ht 67.0 in | Wt 214.0 lb

## 2024-03-13 DIAGNOSIS — L309 Dermatitis, unspecified: Secondary | ICD-10-CM | POA: Diagnosis not present

## 2024-03-13 DIAGNOSIS — F419 Anxiety disorder, unspecified: Secondary | ICD-10-CM | POA: Diagnosis not present

## 2024-03-13 MED ORDER — CEPHALEXIN 500 MG PO CAPS: 500.0000 mg | ORAL_CAPSULE | Freq: Two times a day (BID) | ORAL | 0 refills | Status: DC

## 2024-03-13 MED ORDER — SERTRALINE HCL 25 MG PO TABS: 25.0000 mg | ORAL_TABLET | Freq: Every day | ORAL | 0 refills | Status: DC

## 2024-03-13 NOTE — Progress Notes (Unsigned)
 Primary Care / Sports Medicine Office Visit  Patient Information:  Patient ID: Brandy Vasquez, female DOB: 04/21/1976 Age: 48 y.o. MRN: [id number]   Brandy Vasquez is a pleasant 48 y.o. female presenting with the following:  Chief Complaint  Patient presents with   Breast Problem    Knot under right breast. Tender to touch and red. Patient states she has had this before but not with the pain and swelling. This current bump was noticed on Saturday 03/10/24.    Vitals:   03/13/24 1415  BP: 120/78  Pulse: 81  SpO2: 95%   Vitals:   03/13/24 1415  Weight: 214 lb (97.1 kg)  Height: 5\' 7"  (1.702 m)   Body mass index is 33.52 kg/m.  No results found.   Independent interpretation of notes and tests performed by another provider:   None  Procedures performed:   None  Pertinent History, Exam, Impression, and Recommendations:   Problem List Items Addressed This Visit     Anxiety   She has not experienced any other symptoms such as dizziness or nausea outside of a stressful episode with her daughter, which she believes may have triggered these symptoms. She experiences stress frequently, both from her children and her job, which she describes as more stressful than her children.  Stress-related symptoms Chronic stress-related symptoms, including dizziness and nausea, exacerbated by family and work stress. Discussed potential benefit of SSRI medication to improve stress resilience. Explained SSRI mechanism and potential side effects. Informed consent obtained for SSRI treatment. - Prescribe SSRI medication. - Provide information on mindfulness and physical activity. - Offer referral for behavioral therapy if interested. - Schedule follow-up video visit in two months to assess medication efficacy.      Relevant Medications   sertraline (ZOLOFT) 25 MG tablet   Dermatitis - Primary   History of Present Illness Brandy Vasquez is a 48 year old female with rheumatoid arthritis who  presents with a painful, swollen rash under her right breast.  She has a tender, red rash under her right breast that began this past Saturday. It is painful and swollen, which is a new development compared to previous episodes where she experienced similar rashes under both breasts without pain or swelling. The rash has worsened since it started, with increased swelling noted yesterday and this morning. It typically presents as small blisters that are sore and burn when in contact with water, such as during a shower.  She has a history of similar rashes under both breasts, previously diagnosed by a dermatologist as a dry skin condition. The dermatologist recommended using lotion. She also visited her OB GYN, who did not find anything wrong and referred her to a dermatologist. The rash has not been associated with fever, chills, or drainage. She has not tried any new treatments for this episode, but notes that the swelling was more pronounced this morning than it is currently.  Physical Exam BREAST: Right breast inframammary region is swollen with palpable nodules, tender, and erythematous.  Assessment and Plan Skin infection of the breast Acute cellulitis under the right breast with redness, tenderness, and swelling. Differential includes possible folliculitis, early furuncle. Current presentation indicates cellulitis. - Prescribe antibiotics. - Advise warm compress application. - Instruct on gentle massage and cleaning with soap and water. - Advise against popping lesions. - Refer to dermatology for further evaluation. - Instruct to monitor for drainage and keep the area covered if it occurs.        Orders &  Medications Medications:  Meds ordered this encounter  Medications   sertraline (ZOLOFT) 25 MG tablet    Sig: Take 1 tablet (25 mg total) by mouth daily.    Dispense:  60 tablet    Refill:  0   cephALEXin (KEFLEX) 500 MG capsule    Sig: Take 1 capsule (500 mg total) by mouth 2  (two) times daily.    Dispense:  14 capsule    Refill:  0   No orders of the defined types were placed in this encounter.    Return in about 2 months (around 05/13/2024) for MyChart Video Visit.     Ma Saupe, MD, [facility]   Primary Care Sports Medicine Primary Care and Sports Medicine at [facility]

## 2024-03-19 DIAGNOSIS — L309 Dermatitis, unspecified: Secondary | ICD-10-CM | POA: Insufficient documentation

## 2024-03-19 DIAGNOSIS — F419 Anxiety disorder, unspecified: Secondary | ICD-10-CM | POA: Insufficient documentation

## 2024-03-19 NOTE — Assessment & Plan Note (Signed)
 She has not experienced any other symptoms such as dizziness or nausea outside of a stressful episode with her daughter, which she believes may have triggered these symptoms. She experiences stress frequently, both from her children and her job, which she describes as more stressful than her children.  Stress-related symptoms Chronic stress-related symptoms, including dizziness and nausea, exacerbated by family and work stress. Discussed potential benefit of SSRI medication to improve stress resilience. Explained SSRI mechanism and potential side effects. Informed consent obtained for SSRI treatment. - Prescribe SSRI medication. - Provide information on mindfulness and physical activity. - Offer referral for behavioral therapy if interested. - Schedule follow-up video visit in two months to assess medication efficacy.

## 2024-03-19 NOTE — Assessment & Plan Note (Signed)
 History of Present Illness Brandy Vasquez is a 48 year old female with rheumatoid arthritis who presents with a painful, swollen rash under her right breast.  She has a tender, red rash under her right breast that began this past Saturday. It is painful and swollen, which is a new development compared to previous episodes where she experienced similar rashes under both breasts without pain or swelling. The rash has worsened since it started, with increased swelling noted yesterday and this morning. It typically presents as small blisters that are sore and burn when in contact with water, such as during a shower.  She has a history of similar rashes under both breasts, previously diagnosed by a dermatologist as a dry skin condition. The dermatologist recommended using lotion. She also visited her OB GYN, who did not find anything wrong and referred her to a dermatologist. The rash has not been associated with fever, chills, or drainage. She has not tried any new treatments for this episode, but notes that the swelling was more pronounced this morning than it is currently.  Physical Exam BREAST: Right breast inframammary region is swollen with palpable nodules, tender, and erythematous.  Assessment and Plan Skin infection of the breast Acute cellulitis under the right breast with redness, tenderness, and swelling. Differential includes possible folliculitis, early furuncle. Current presentation indicates cellulitis. - Prescribe antibiotics. - Advise warm compress application. - Instruct on gentle massage and cleaning with soap and water. - Advise against popping lesions. - Refer to dermatology for further evaluation. - Instruct to monitor for drainage and keep the area covered if it occurs.

## 2024-03-19 NOTE — Patient Instructions (Signed)
 Patient Action Plan  1. Anxiety Management:    - Start taking the prescribed SSRI medication (sertraline 25 mg).    - Practice mindfulness exercises and engage in regular physical activity.    - Consider a referral for behavioral therapy if interested.    - Schedule a follow-up video visit in two months to evaluate the effectiveness of the medication.  2. Skin Infection Under Right Breast:    - Take the prescribed antibiotics as directed.    - Apply warm compresses to the affected area.    - Gently massage and clean the area with soap and water.    - Do not pop any lesions.    - Monitor for any drainage; keep the area covered if drainage occurs.    - Attend a dermatology appointment for further evaluation.  Red Flags: Seek immediate medical attention if you experience a high fever, increased redness or swelling, or if the rash starts to drain pus excessively.

## 2024-03-21 DIAGNOSIS — M0579 Rheumatoid arthritis with rheumatoid factor of multiple sites without organ or systems involvement: Secondary | ICD-10-CM | POA: Diagnosis not present

## 2024-04-04 ENCOUNTER — Other Ambulatory Visit: Payer: Self-pay | Admitting: Family Medicine

## 2024-04-04 DIAGNOSIS — F419 Anxiety disorder, unspecified: Secondary | ICD-10-CM

## 2024-04-06 NOTE — Telephone Encounter (Signed)
 Requested medication (s) are due for refill today: no  Requested medication (s) are on the active medication list: yes  Last refill:  03/13/24 60 tab  Future visit scheduled: yes  Notes to clinic:  pharmacy asking for 90 day refill   Requested Prescriptions  Pending Prescriptions Disp Refills   sertraline  (ZOLOFT ) 25 MG tablet [Pharmacy Med Name: SERTRALINE  HCL 25 MG TABLET] 90 tablet 1    Sig: Take 1 tablet (25 mg total) by mouth daily.     Psychiatry:  Antidepressants - SSRI - sertraline  Failed - 04/06/2024 10:43 AM      Failed - AST in normal range and within 360 days    AST  Date Value Ref Range Status  01/19/2022 16 0 - 40 IU/L Final         Failed - ALT in normal range and within 360 days    ALT  Date Value Ref Range Status  01/19/2022 21 0 - 32 IU/L Final         Failed - Valid encounter within last 6 months    Recent Outpatient Visits           3 weeks ago Dermatitis   [place] [facility], Dessie Flow, MD   2 months ago Dizziness   [facility] at [facility], Dessie Flow, MD       Future Appointments             In 1 month Augustus Ledger, Dessie Flow, MD [facility] at [facility], [facility]   In 1 month Augustus Ledger, Dessie Flow, MD [facility] at [facility]            Passed - Completed PHQ-2 or PHQ-9 in the last 360 days

## 2024-04-10 ENCOUNTER — Other Ambulatory Visit: Payer: Self-pay | Admitting: Family Medicine

## 2024-04-10 ENCOUNTER — Encounter: Payer: Self-pay | Admitting: Family Medicine

## 2024-04-10 DIAGNOSIS — F419 Anxiety disorder, unspecified: Secondary | ICD-10-CM

## 2024-04-10 MED ORDER — SERTRALINE HCL 25 MG PO TABS
25.0000 mg | ORAL_TABLET | Freq: Every day | ORAL | 0 refills | Status: DC
Start: 2024-04-10 — End: 2024-05-14

## 2024-04-10 NOTE — Telephone Encounter (Signed)
 Please review and adivse.  JM

## 2024-05-01 ENCOUNTER — Other Ambulatory Visit: Payer: Self-pay | Admitting: Family Medicine

## 2024-05-01 DIAGNOSIS — F419 Anxiety disorder, unspecified: Secondary | ICD-10-CM

## 2024-05-03 NOTE — Telephone Encounter (Signed)
 Requested medication (s) are due for refill today: yes  Requested medication (s) are on the active medication list: yes  Last refill:  04/10/24 #30  Future visit scheduled: yes  Notes to clinic:  overdue lab work    Requested Prescriptions  Pending Prescriptions Disp Refills   sertraline  (ZOLOFT ) 25 MG tablet [Pharmacy Med Name: SERTRALINE  HCL 25 MG TABLET] 90 tablet 1    Sig: Take 1 tablet (25 mg total) by mouth daily.     Psychiatry:  Antidepressants - SSRI - sertraline  Failed - 2/89/7974 11:21 AM      Failed - AST in normal range and within 360 days    AST  Date Value Ref Range Status  01/19/2022 16 0 - 40 IU/L Final         Failed - ALT in normal range and within 360 days    ALT  Date Value Ref Range Status  01/19/2022 21 0 - 32 IU/L Final         Passed - Completed PHQ-2 or PHQ-9 in the last 360 days      Passed - Valid encounter within last 6 months    Recent Outpatient Visits           1 month ago Dermatitis   [place] [facility], Selinda PARAS, MD   3 months ago Dizziness   [facility] at [facility], Selinda PARAS, MD       Future Appointments             In 1 week Ku, Selinda PARAS, MD [facility] Primary Care & Sports Medicine at [facility], [facility]   In 3 weeks Ku, Selinda PARAS, MD [facility] Primary Care & Sports Medicine at [facility], [facility]

## 2024-05-14 ENCOUNTER — Encounter: Payer: Self-pay | Admitting: Family Medicine

## 2024-05-14 ENCOUNTER — Telehealth (INDEPENDENT_AMBULATORY_CARE_PROVIDER_SITE_OTHER): Admitting: Family Medicine

## 2024-05-14 DIAGNOSIS — L309 Dermatitis, unspecified: Secondary | ICD-10-CM

## 2024-05-14 DIAGNOSIS — F419 Anxiety disorder, unspecified: Secondary | ICD-10-CM | POA: Diagnosis not present

## 2024-05-14 DIAGNOSIS — L988 Other specified disorders of the skin and subcutaneous tissue: Secondary | ICD-10-CM

## 2024-05-14 MED ORDER — SERTRALINE HCL 50 MG PO TABS: 50.0000 mg | ORAL_TABLET | Freq: Every day | ORAL | 0 refills | Status: DC

## 2024-05-14 NOTE — Progress Notes (Signed)
 Primary Care / Sports Medicine Virtual Visit  Patient Information:  Patient ID: Brandy Vasquez, female DOB: April 02, 1976 Age: 48 y.o. MRN: [id number]   Brandy Vasquez is a pleasant 48 y.o. female presenting with the following:  Chief Complaint  Patient presents with   Anxiety    Patient following up on anxiety. He has been taking Zoloft  and it has been helping. She would like to continue taking medication and discuss going up in dosage.    Review of Systems: No fevers, chills, night sweats, weight loss, chest pain, or shortness of breath.   Patient Active Problem List   Diagnosis Date Noted   Dermatitis 03/19/2024   Anxiety 03/19/2024   Dizziness 02/25/2023   Screening for colon cancer 02/14/2023   Chronic idiopathic constipation 10/12/2022   Malodorous urine 08/09/2022   Encounter for weight management 08/09/2022   Arthralgia of left hip 07/12/2022   Breast pain, left 02/05/2022   Leg cramps 01/19/2022   Class 1 obesity with serious comorbidity and body mass index (BMI) of 31.0 to 31.9 in adult 01/19/2022   Long-term use of immunosuppressant medication 12/24/2021   Rheumatoid arthritis involving multiple sites with positive rheumatoid factor (HCC) 12/24/2021   Injury of triangular fibrocartilage complex of left wrist 12/08/2021   Cyclic citrullinated peptide (CCP) antibody positive 10/29/2021   Cervical spondylosis with radiculopathy 10/07/2021   Gastroesophageal reflux disease without esophagitis 10/07/2021   Furuncle 08/26/2021   Cervical paraspinal muscle spasm 08/04/2021   Metabolic syndrome 05/27/2021   Rotator cuff arthropathy of left shoulder 05/27/2021   Primary hypertension 05/15/2021   Annual physical exam 04/03/2021   Fungal infection of skin 04/03/2021   Elevated blood pressure reading 04/03/2021   Dysplasia of cervix, low grade (CIN 1) 08/31/2018   Past Medical History:  Diagnosis Date   Dysplasia of cervix, low grade (CIN 1)    GERD (gastroesophageal reflux  disease)    Herpes    Hypertension 05/15/2021   on lisinopril /hctz10/12.5 for about a month only   Rheumatoid arthritis [facility])    Outpatient Encounter Medications as of 05/14/2024  Medication Sig   folic acid  (FOLVITE ) 1 MG tablet Take 1 mg by mouth daily.   levonorgestrel  (MIRENA ) 20 MCG/DAY IUD 1 each by Intrauterine route once.   methotrexate (RHEUMATREX) 2.5 MG tablet Take 15 mg by mouth once a week. sunday   REMICADE 100 MG injection Inject 100 mg into the vein every 8 (eight) weeks. Next dose Feb 23, 2023   Rimegepant Sulfate (NURTEC) 75 MG TBDP Take 1 tablet (75 mg total) by mouth daily as needed (Migraine). Can repeat in 24 hours if needed.   sertraline  (ZOLOFT ) 50 MG tablet Take 1 tablet (50 mg total) by mouth daily.   [DISCONTINUED] sertraline  (ZOLOFT ) 25 MG tablet Take 1 tablet (25 mg total) by mouth daily.   cephALEXin  (KEFLEX ) 500 MG capsule Take 1 capsule (500 mg total) by mouth 2 (two) times daily.   gabapentin (NEURONTIN) 100 MG capsule Take 100 mg by mouth 2 (two) times daily as needed.   meclizine  (ANTIVERT ) 25 MG tablet Take 1 tablet (25 mg total) by mouth 3 (three) times daily as needed for dizziness.   No facility-administered encounter medications on file as of 05/14/2024.   Past Surgical History:  Procedure Laterality Date   BREAST BIOPSY Left 2016   COLONOSCOPY WITH PROPOFOL  N/A 02/14/2023   Procedure: COLONOSCOPY WITH PROPOFOL ;  Surgeon: Unk Corinn Skiff, MD;  Location: [facility] ENDOSCOPY;  Service: Gastroenterology;  Laterality:  N/A;   INTRAUTERINE DEVICE (IUD) INSERTION N/A 01/31/2023   Procedure: INTRAUTERINE DEVICE (IUD) INSERTION;  Surgeon: Janit Alm Agent, MD;  Location: [facility] ORS;  Service: Gynecology;  Laterality: N/A;   IUD REMOVAL N/A 01/31/2023   Procedure: INTRAUTERINE DEVICE (IUD) REMOVAL;  Surgeon: Janit Alm Agent, MD;  Location: [facility] ORS;  Service: Gynecology;  Laterality: N/A;   LEEP N/A 01/31/2023   Procedure: LOOP ELECTROSURGICAL EXCISION PROCEDURE  (LEEP);  Surgeon: Janit Alm Agent, MD;  Location: [facility] ORS;  Service: Gynecology;  Laterality: N/A;    Virtual Visit via MyChart Video:   I connected with Brandy Vasquez on 05/14/24 via MyChart Video and verified that I am speaking with the correct person using appropriate identifiers.   The limitations, risks, security and privacy concerns of performing an evaluation and management service by MyChart Video, including the higher likelihood of inaccurate diagnoses and treatments, and the availability of in person appointments were reviewed. The possible need of an additional face-to-face encounter for complete and high quality delivery of care was discussed. The patient was also made aware that there may be a patient responsible charge related to this service. The patient expressed understanding and wishes to proceed.  Provider location is in medical facility. Patient location is at their home, different from provider location. People involved in care of the patient during this telehealth encounter were myself, my nurse/medical assistant, and my front office/scheduling team member.  Objective findings:   General: Speaking full sentences, no audible heavy breathing. Sounds alert and appropriately interactive. Well-appearing. Face symmetric. Extraocular movements intact. Pupils equal and round. No nasal flaring or accessory muscle use visualized.  Independent interpretation of notes and tests performed by another provider:   None  Pertinent History, Exam, Impression, and Recommendations:   Problem List Items Addressed This Visit     Anxiety - Primary   Dizziness and disequilibrium - Initiated sertraline  medication on May 20th - By June 17th, symptoms of lightheadedness and feeling off balance had improved but were not completely resolved - Sertraline  25 mg is taken in the evening due to night shift work schedule - Dizziness has improved with current treatment - No recent  migraines  Dizziness Dizziness improved with current treatment, no neurology referral needed. - Increase sertraline  to 50 mg once daily in the evening. - Monitor symptoms, follow-up in one month.  Anxiety / Depression Discussed medication role and benefits of behavioral therapy and mindfulness. - Increase sertraline  to 50 mg once daily. - Send mindfulness information via MyChart. - Submit referral for behavioral therapy.      Relevant Medications   sertraline  (ZOLOFT ) 50 MG tablet   Other Relevant Orders   Ambulatory referral to Psychology   Dermatitis   Recurrent dermatological rash - Experiences a recurring inframammary rash, with the most recent episode occurring last week - Most recent rash was less severe and resolved more quickly than previous episodes - Past rashes have responded to antibiotics, compresses, and massage - Has not yet seen a dermatologist due to referral issues  Recurrent breast skin rash Recent rash resolved faster, dermatology referral previously denied. - Submit new dermatology referral. - Advise warm compresses, keep area clean and dry, avoid picking. - Contact if rash flares for treatment.      Relevant Orders   Ambulatory referral to Dermatology   Other Visit Diagnoses       Lesion of skin of breast       Relevant Orders   Ambulatory referral to Dermatology  Orders & Medications Medications:  Meds ordered this encounter  Medications   sertraline  (ZOLOFT ) 50 MG tablet    Sig: Take 1 tablet (50 mg total) by mouth daily.    Dispense:  90 tablet    Refill:  0   Orders Placed This Encounter  Procedures   Ambulatory referral to Psychology   Ambulatory referral to Dermatology     I discussed the above assessment and treatment plan with the patient. The patient was provided an opportunity to ask questions and all were answered. The patient agreed with the plan and demonstrated an understanding of the instructions.   The patient  was advised to call back or seek an in-person evaluation if the symptoms worsen or if the condition fails to improve as anticipated.   I provided a total time of 30 minutes including both face-to-face and non-face-to-face time on 05/14/2024 inclusive of time utilized for medical chart review, information gathering, care coordination with staff, and documentation completion.    [name], MD, [facility]   Primary Care Sports Medicine [facility] at [facility]

## 2024-05-14 NOTE — Assessment & Plan Note (Signed)
 Recurrent dermatological rash - Experiences a recurring inframammary rash, with the most recent episode occurring last week - Most recent rash was less severe and resolved more quickly than previous episodes - Past rashes have responded to antibiotics, compresses, and massage - Has not yet seen a dermatologist due to referral issues  Recurrent breast skin rash Recent rash resolved faster, dermatology referral previously denied. - Submit new dermatology referral. - Advise warm compresses, keep area clean and dry, avoid picking. - Contact if rash flares for treatment.

## 2024-05-14 NOTE — Assessment & Plan Note (Signed)
 Dizziness and disequilibrium - Initiated sertraline  medication on May 20th - By June 17th, symptoms of lightheadedness and feeling off balance had improved but were not completely resolved - Sertraline  25 mg is taken in the evening due to night shift work schedule - Dizziness has improved with current treatment - No recent migraines  Dizziness Dizziness improved with current treatment, no neurology referral needed. - Increase sertraline  to 50 mg once daily in the evening. - Monitor symptoms, follow-up in one month.  Anxiety / Depression Discussed medication role and benefits of behavioral therapy and mindfulness. - Increase sertraline  to 50 mg once daily. - Send mindfulness information via MyChart. - Submit referral for behavioral therapy.

## 2024-05-14 NOTE — Patient Instructions (Signed)
 Patient Plan for Post-Visit Guidance  1. Anxiety and Dizziness:    - Increase sertraline  to 50 mg once daily in the evening.    - Monitor symptoms and follow up in one month.    - Review mindfulness information via MyChart.    - A referral for behavioral therapy has been submitted.  2. Recurrent Dermatitis:    - A new referral to dermatology has been submitted.    - Use warm compresses, keep the affected area clean and dry, and avoid picking at the rash.    - Contact the office if the rash flares up for further treatment.  Red Flags: - If you experience any worsening of dizziness, anxiety, or skin rash, contact the office immediately.

## 2024-05-29 ENCOUNTER — Encounter: Admitting: Family Medicine

## 2024-06-07 DIAGNOSIS — Z111 Encounter for screening for respiratory tuberculosis: Secondary | ICD-10-CM | POA: Diagnosis not present

## 2024-06-07 DIAGNOSIS — M79671 Pain in right foot: Secondary | ICD-10-CM | POA: Diagnosis not present

## 2024-06-07 DIAGNOSIS — M0579 Rheumatoid arthritis with rheumatoid factor of multiple sites without organ or systems involvement: Secondary | ICD-10-CM | POA: Diagnosis not present

## 2024-06-07 DIAGNOSIS — Z7960 Long term (current) use of unspecified immunomodulators and immunosuppressants: Secondary | ICD-10-CM | POA: Diagnosis not present

## 2024-06-07 NOTE — Progress Notes (Signed)
 Rheumatology Follow Up Note  Chief Complaint  Patient presents with  . Rheumatoid arthritis involving multiple sites with positive      Subjective:HPI  Brandy Vasquez is a 48 y.o. female is here today for follow up of rheumatoid arthritis stage 2 moderate. The patient's allergies, current medications, past family history, past medical history, past social history, past surgical history and problem list were reviewed and updated as appropriate.   She is taking methotrexate, Remicade and folic acid . She is tolerating this well. She is awaiting Remicade infusion as insurance needed to be sorted out so has missed one infusion. She is able to form a fist. She has no joint swelling. She does get lower extremity swelling, is on her feet for work. She has no fever, infection, nasal/oral ulcer, dyspnea or cough, rash.   Review of Systems:   Review of Systems  Constitutional:  Negative for fatigue.  HENT:  Negative for mouth sores and trouble swallowing.        Neg: Dry Mouth  Eyes:  Negative for redness.       Neg: Dry Eyes  Respiratory:  Negative for cough and shortness of breath.   Cardiovascular:  Negative for chest pain and leg swelling.  Gastrointestinal:  Negative for constipation, diarrhea and nausea.  Endocrine: Negative for cold intolerance and heat intolerance.  Genitourinary:  Negative for hematuria.  Musculoskeletal:        Per HPI  Skin:  Negative for color change and rash.  Neurological:  Negative for dizziness, weakness, numbness and headaches.  Hematological:  Does not bruise/bleed easily.  Psychiatric/Behavioral:  Negative for dysphoric mood and sleep disturbance. The patient is not nervous/anxious.   All other systems reviewed and are negative.   Objective:  Vitals:   06/07/24 0820  BP: 132/80  Temp: 36.1 C (97 F)  TempSrc: Temporal  Weight: 97.1 kg (214 lb 1.1 oz)  Height: 167.6 cm (5' 6)  PainSc: 0-No pain     Length of Stiffness: 45 minutes   GEN -  Pleasant, No Apparent Distress  HEENT - normocephalic and atraumatic. Conjunctiva Clear.   Neck - supple with no adenopathy or thyromegaly.   C spine with full range of motion. Heart - regular rate and rhythm, No murmurs/gallops/rub, Nml S1S2 Lungs - clear to auscultation in all fields. Extremities - there is no cyanosis; Trace edema. Neurological - alert and oriented.  Spine - no paraspinal tenderness; No Lumbar Spine Tenderness Skin - no rashes observed MSK - The following joints were examined bilaterally: Hands, Wrists, Elbows, Shoulders, Metatarsals, Ankels, Knees and Hips; they were normal apart from what is noted.    100% Fist Formation Mild Tenderness of the PIP Joints  No Synovitis No Dactylitis No Tender Point ______________________________________________________________________ Labs/Imaging Reviewed in EMR Cr 0.80, AST 19, ALT 18 Normal CBC HgA1c 4.7 Vitamin D  24.6 (l) ESR 7 Pos: ANA 1:80, Anti CCP > 250; RF 61 Neg: Anti Centromere, AntidsDNA, AntiSm, AntiU1 RNP, Anti SSA, Anit SSB, Anti Scl-70, Anti TPO, Anti chromatin Neg: AntiCardiolipin Neg: Hep B and C; HIV; Quantiferon    Both Hand Xray (08/2021): No erosive changes on xray    Right Wrist Xray (08/2021): Possible mild ulnar soft tissue edema. Otherwise negative radiographs of the right wrist. No significant degenerative or evidence of inflammatory arthropathy.    Left Wrist Xray (08/2021): No Erosive or inflammatory changes  CDAI is calculated as follows:  CDAI = SJC + TJC +PGA + EGA = 6 Where:  SJC =  Swollen Joint Count TJC = Tender Joint Count PGA = Patient Global Assessment of Disease Activity EGA = Evaluator Global Assessment of Disease Activity  Interpretation <=2.8: Remission >2.8 and <=10: Low Disease Activity >10 and <=22: Moderate Disease Activity >22: High Disease Activity    Assessment and Plan   1. Rheumatoid Arthritis, Stage 2 moderate: Stable  -- Diagnosed March 2023, Positive  AntiCCP, RF, Stiffness, Mild synovitis and tenderness of multiple joints -- Cimzia denied by insurance -- Continue methotrexate 10 mg weekly and folic acid  1 mg daily; High dose caused LFT elevation  -- Continue Plaquenil 200 mg twice daily -- Continue Remicade Infusion (05/21/2022); 3 Vials Every 8 Weeks    2. Long term use of immunosuppressive therapy -- Methotrexate and Remicade are an immunosuppressive medication that require drug toxicity monitoring. -- Hydroxychloroquine (Plaquenil) is an immunosuppressive medication that require monitoring for eye toxicity. Lens Crafter -- Recommend podiatry evaluation for the heel spur for possible injection  -- Check Labs   Diagnoses and all orders for this visit:  Rheumatoid arthritis involving multiple sites with positive rheumatoid factor (CMS/HHS-HCC) -     hydroxychloroquine (PLAQUENIL) 200 mg tablet; Take 1 tablet (200 mg total) by mouth 2 (two) times daily -     Alanine Aminotransferase (ALT) -     Albumin -     Aspartate Aminotransferase (AST) -     CBC w/auto Differential (5 Part) -     Creatinine -     Sedimentation Rate-Automated -     QuantiFERON-TB Gold Plus - [facility]  Long-term use of immunosuppressant medication -     Alanine Aminotransferase (ALT) -     Albumin -     Aspartate Aminotransferase (AST) -     CBC w/auto Differential (5 Part) -     Creatinine -     Sedimentation Rate-Automated -     QuantiFERON-TB Gold Plus - [facility]  Pain of right heel  Tuberculosis screening -     QuantiFERON-TB Gold Plus - [facility]       Return in about 4 months (around 10/07/2024) for Routine Follow Up.   All new prescription medications, changes in current prescription dosages, and sample medications were discussed with the patient, including patient education, medication name, use, dosage, potential side effects, drug interactions, consequences of not using/taking, and special instructions.  Patient expressed understanding.  No  barriers to adherence.   I appreciate the opportunity to participate in the care of Brandy Vasquez. Please do not hesitate to contact me with any questions or concerns that may arise in regards to the patient's rheumatologic disease.    Attestation Statement:   I personally performed the service. (TP)  MAYUR LOREE BLANCH, MD

## 2024-07-12 ENCOUNTER — Ambulatory Visit: Admitting: Family Medicine

## 2024-07-12 VITALS — BP 92/68 | HR 82 | Ht 67.0 in | Wt 206.8 lb

## 2024-07-12 DIAGNOSIS — N926 Irregular menstruation, unspecified: Secondary | ICD-10-CM | POA: Diagnosis not present

## 2024-07-12 DIAGNOSIS — R55 Syncope and collapse: Secondary | ICD-10-CM | POA: Insufficient documentation

## 2024-07-12 DIAGNOSIS — F419 Anxiety disorder, unspecified: Secondary | ICD-10-CM

## 2024-07-12 DIAGNOSIS — Z0000 Encounter for general adult medical examination without abnormal findings: Secondary | ICD-10-CM

## 2024-07-12 NOTE — Assessment & Plan Note (Signed)
 Menstrual irregularity - Irregular menstrual cycles due to Mirena  IUD - Heavier than usual period one week prior to the episode - No significant menstrual cycle in the three months prior to the recent heavy period  Recent heavier menstrual bleeding Heavier menstrual bleeding on Mirena  may have contributed to hypovolemia and hypotension.

## 2024-07-12 NOTE — Patient Instructions (Signed)
 VISIT SUMMARY:  During your visit, we discussed your recent episodes of dizziness and near fainting, which may be related to low blood volume and low blood pressure. We also reviewed your recent heavier menstrual bleeding and increased stress levels.  YOUR PLAN:  DIZZINESS AND NEAR SYNCOPE: You have been experiencing dizzy spells and a near fainting episode, likely due to low blood volume and low blood pressure. -We will order blood tests to check your electrolytes, thyroid  function, blood count, and iron levels. -Please make sure to drink plenty of fluids with electrolytes, such as Gatorade or Pedialyte. -Monitor your symptoms and seek urgent care if you continue to feel lightheaded.  RECENT HEAVIER MENSTRUAL BLEEDING: Your recent heavier menstrual bleeding may have contributed to your low blood volume and low blood pressure. -We will monitor your menstrual cycles and adjust treatment if necessary.  STRESS: You have been experiencing increased stress due to a recent family death. -Continue taking your sertraline  as prescribed. -Consider stress management techniques such as deep breathing exercises, meditation, or talking to a counselor.

## 2024-07-12 NOTE — Progress Notes (Signed)
 Primary Care / Sports Medicine Office Visit  Patient Information:  Patient ID: Brandy Vasquez, female DOB: February 04, 1976 Age: 48 y.o. MRN: [id number]   Brandy Vasquez is a pleasant 48 y.o. female presenting with the following:  Chief Complaint  Patient presents with   Dizziness    Dizziness for a while now, last Saturday 07/07/24 had a dizzy spell was not one of her normal dizzy spell. Patient was exhausted for 2 days after this particular dizzy spell. During the dizzy spell she was in a cold sweat and after had diarrhea for a day and a half.     Vitals:   07/12/24 1051  BP: 92/68  Pulse: 82  SpO2: 100%   Vitals:   07/12/24 1051  Weight: 206 lb 12.8 oz (93.8 kg)  Height: 5' 7 (1.702 m)   Body mass index is 32.39 kg/m.  No results found.   Discussed the use of AI scribe software for clinical note transcription with the patient, who gave verbal consent to proceed.   Independent interpretation of notes and tests performed by another provider:   None  Procedures performed:   None  Pertinent History, Exam, Impression, and Recommendations:   Problem List Items Addressed This Visit     Anxiety   Psychological stress and medication adherence - Increased stress due to recent death in the family - Taking sertraline  for stress management - Adherent to sertraline  regimen      Menstrual irregularity   Menstrual irregularity - Irregular menstrual cycles due to Mirena  IUD - Heavier than usual period one week prior to the episode - No significant menstrual cycle in the three months prior to the recent heavy period  Recent heavier menstrual bleeding Heavier menstrual bleeding on Mirena  may have contributed to hypovolemia and hypotension.      Near syncope - Primary   History of Present Illness Brandy Vasquez is a 48 year old female who presents with dizziness and syncope.  Dizziness and syncope - Dizzy spells since Saturday, more severe and different than usual episodes  (which had been improving following sertraline ) - Dizziness characterized by sensation of the room getting dark, not spinning like usual, falling into tunnel - Most recent episode occurred while sitting in the garage with a space heater on; felt hot prior to onset - During episode, dropped iPad, head went back, appeared to lose consciousness; unclear if true syncope occurred - Cold sweat and exhaustion for two days following the episode - No loss of bowel or bladder control during the episode  Gastrointestinal symptoms - Decreased appetite on the day of the episode; consumed only a small amount of fries and a piece of a burger - Diarrhea for a day and a half following the dizzy spell  Substance use - Vapes THC but did not use on the day of the episode - No recent smoking of other substances or recreational drug use on the day of the episode  Physical Exam VITALS: BP- 92/68 NEUROVASCULAR: Cranial nerves II-XII intact. CHEST: Lungs clear to auscultation bilaterally, no wheezes. CARDIOVASCULAR: Regular rate and rhythm, normal S1 and S2, no additional heart sounds. No jugular venous distention, no carotid bruits. Symmetric 2+ radial pulses bilaterally. EXTREMITIES: No pedal edema.  Assessment and Plan Near syncope due to probable hypovolemia with hypotension and decreased appetite Near syncope likely due to hypovolemia from decreased oral intake and heavier menstrual bleeding. Blood pressure at 92/68 today. Differential includes low blood volume and potential electrolyte imbalance. - Order blood  tests for electrolytes, thyroid  function, blood count, and iron levels. - Advise adequate fluid intake with electrolytes, such as Gatorade or Pedialyte. - Monitor symptoms and seek emergent care if lightheadedness recurs.      Relevant Orders   CBC   TSH + free T4   Iron, TIBC and Ferritin Panel   Other Visit Diagnoses       Healthcare maintenance       Relevant Orders   CBC    Comprehensive metabolic panel with GFR   Hemoglobin A1c   Lipid panel        Orders & Medications Medications: No orders of the defined types were placed in this encounter.  Orders Placed This Encounter  Procedures   CBC   Comprehensive metabolic panel with GFR   Hemoglobin A1c   Lipid panel   TSH + free T4   Iron, TIBC and Ferritin Panel     No follow-ups on file.     [name], MD, [facility]   Primary Care Sports Medicine Primary Care and Sports Medicine at [facility]

## 2024-07-12 NOTE — Assessment & Plan Note (Signed)
 Psychological stress and medication adherence - Increased stress due to recent death in the family - Taking sertraline  for stress management - Adherent to sertraline  regimen

## 2024-07-12 NOTE — Assessment & Plan Note (Addendum)
 History of Present Illness Sahithi Ordoyne is a 48 year old female who presents with dizziness and syncope.  Dizziness and syncope - Dizzy spells since Saturday, more severe and different than usual episodes (which had been improving following sertraline ) - Dizziness characterized by sensation of the room getting dark, not spinning like usual, falling into tunnel - Most recent episode occurred while sitting in the garage with a space heater on; felt hot prior to onset - During episode, dropped iPad, head went back, appeared to lose consciousness; unclear if true syncope occurred - Cold sweat and exhaustion for two days following the episode - No loss of bowel or bladder control during the episode  Gastrointestinal symptoms - Decreased appetite on the day of the episode; consumed only a small amount of fries and a piece of a burger - Diarrhea for a day and a half following the dizzy spell  Substance use - Vapes THC but did not use on the day of the episode - No recent smoking of other substances or recreational drug use on the day of the episode  Physical Exam VITALS: BP- 92/68 NEUROVASCULAR: Cranial nerves II-XII intact. CHEST: Lungs clear to auscultation bilaterally, no wheezes. CARDIOVASCULAR: Regular rate and rhythm, normal S1 and S2, no additional heart sounds. No jugular venous distention, no carotid bruits. Symmetric 2+ radial pulses bilaterally. EXTREMITIES: No pedal edema.  Assessment and Plan Near syncope due to probable hypovolemia with hypotension and decreased appetite Near syncope likely due to hypovolemia from decreased oral intake and heavier menstrual bleeding. Blood pressure at 92/68 today. Differential includes low blood volume and potential electrolyte imbalance. - Order blood tests for electrolytes, thyroid  function, blood count, and iron levels. - Advise adequate fluid intake with electrolytes, such as Gatorade or Pedialyte. - Monitor symptoms and seek emergent care  if lightheadedness recurs.

## 2024-07-13 LAB — COMPREHENSIVE METABOLIC PANEL WITH GFR
ALT: 15 IU/L (ref 0–32)
AST: 16 IU/L (ref 0–40)
Albumin: 4.5 g/dL (ref 3.9–4.9)
Alkaline Phosphatase: 109 IU/L (ref 41–116)
BUN/Creatinine Ratio: 11 (ref 9–23)
BUN: 10 mg/dL (ref 6–24)
Bilirubin Total: 1 mg/dL (ref 0.0–1.2)
CO2: 24 mmol/L (ref 20–29)
Calcium: 9 mg/dL (ref 8.7–10.2)
Chloride: 103 mmol/L (ref 96–106)
Creatinine, Ser: 0.87 mg/dL (ref 0.57–1.00)
Globulin, Total: 2.5 g/dL (ref 1.5–4.5)
Glucose: 86 mg/dL (ref 70–99)
Potassium: 3.7 mmol/L (ref 3.5–5.2)
Sodium: 141 mmol/L (ref 134–144)
Total Protein: 7 g/dL (ref 6.0–8.5)
eGFR: 83 mL/min/1.73 (ref >59)

## 2024-07-13 LAB — HEMOGLOBIN A1C
Est. average glucose Bld gHb Est-mCnc: 91 mg/dL
Hgb A1c MFr Bld: 4.8 % (ref 4.8–5.6)

## 2024-07-13 LAB — CBC
Hematocrit: 41.9 % (ref 34.0–46.6)
Hemoglobin: 13.7 g/dL (ref 11.1–15.9)
MCH: 32.7 pg (ref 26.6–33.0)
MCHC: 32.7 g/dL (ref 31.5–35.7)
MCV: 100 fL — ABNORMAL HIGH (ref 79–97)
Platelets: 299 x10E3/uL (ref 150–450)
RBC: 4.19 x10E6/uL (ref 3.77–5.28)
RDW: 12.2 % (ref 11.7–15.4)
WBC: 7.9 x10E3/uL (ref 3.4–10.8)

## 2024-07-13 LAB — LIPID PANEL
Chol/HDL Ratio: 3.7 ratio (ref 0.0–4.4)
Cholesterol, Total: 157 mg/dL (ref 100–199)
HDL: 43 mg/dL (ref >39)
LDL Chol Calc (NIH): 103 mg/dL — ABNORMAL HIGH (ref 0–99)
Triglycerides: 56 mg/dL (ref 0–149)
VLDL Cholesterol Cal: 11 mg/dL (ref 5–40)

## 2024-07-13 LAB — IRON,TIBC AND FERRITIN PANEL
Ferritin: 100 ng/mL (ref 15–150)
Iron Saturation: 23 % (ref 15–55)
Iron: 61 ug/dL (ref 27–159)
Total Iron Binding Capacity: 260 ug/dL (ref 250–450)
UIBC: 199 ug/dL (ref 131–425)

## 2024-07-13 LAB — TSH+FREE T4
Free T4: 1.02 ng/dL (ref 0.82–1.77)
TSH: 0.826 u[IU]/mL (ref 0.450–4.500)

## 2024-07-21 ENCOUNTER — Emergency Department (HOSPITAL_BASED_OUTPATIENT_CLINIC_OR_DEPARTMENT_OTHER)
Admission: EM | Admit: 2024-07-21 | Discharge: 2024-07-21 | Disposition: A | Discharge status: Home / Self Care | Attending: Emergency Medicine | Admitting: Emergency Medicine

## 2024-07-21 ENCOUNTER — Other Ambulatory Visit: Payer: Self-pay

## 2024-07-21 ENCOUNTER — Emergency Department (HOSPITAL_BASED_OUTPATIENT_CLINIC_OR_DEPARTMENT_OTHER)

## 2024-07-21 DIAGNOSIS — R55 Syncope and collapse: Secondary | ICD-10-CM | POA: Diagnosis not present

## 2024-07-21 DIAGNOSIS — R519 Headache, unspecified: Secondary | ICD-10-CM | POA: Diagnosis not present

## 2024-07-21 LAB — CBC
HCT: 39.4 % (ref 36.0–46.0)
Hemoglobin: 13.3 g/dL (ref 12.0–15.0)
MCH: 32.6 pg (ref 26.0–34.0)
MCHC: 33.8 g/dL (ref 30.0–36.0)
MCV: 96.6 fL (ref 80.0–100.0)
Platelets: 265 K/uL (ref 150–400)
RBC: 4.08 MIL/uL (ref 3.87–5.11)
RDW: 12.6 % (ref 11.5–15.5)
WBC: 10.7 K/uL — ABNORMAL HIGH (ref 4.0–10.5)
nRBC: 0 % (ref 0.0–0.2)

## 2024-07-21 LAB — COMPREHENSIVE METABOLIC PANEL WITH GFR
ALT: 12 U/L (ref 0–44)
AST: 18 U/L (ref 15–41)
Albumin: 4.5 g/dL (ref 3.5–5.0)
Alkaline Phosphatase: 108 U/L (ref 38–126)
Anion gap: 13 (ref 5–15)
BUN: 9 mg/dL (ref 6–20)
CO2: 24 mmol/L (ref 22–32)
Calcium: 9.1 mg/dL (ref 8.9–10.3)
Chloride: 104 mmol/L (ref 98–111)
Creatinine, Ser: 0.89 mg/dL (ref 0.44–1.00)
GFR, Estimated: >60 mL/min (ref >60)
Glucose, Bld: 119 mg/dL — ABNORMAL HIGH (ref 70–99)
Potassium: 3.4 mmol/L — ABNORMAL LOW (ref 3.5–5.1)
Sodium: 140 mmol/L (ref 135–145)
Total Bilirubin: 1.1 mg/dL (ref 0.0–1.2)
Total Protein: 7.2 g/dL (ref 6.5–8.1)

## 2024-07-21 LAB — CBG MONITORING, ED: Glucose-Capillary: 95 mg/dL (ref 70–99)

## 2024-07-21 MED ORDER — IBUPROFEN 800 MG PO TABS
800.0000 mg | ORAL_TABLET | Freq: Once | ORAL | Status: DC
  Filled 2024-07-21: qty 1

## 2024-07-21 NOTE — ED Triage Notes (Signed)
 Pt POV reporting lethargy, headache and one episode of emesis after smoking marijuana delayed responses in triage.

## 2024-07-21 NOTE — ED Provider Notes (Signed)
 [place] EMERGENCY DEPARTMENT AT [facility] Provider Note   CSN: [id number] Arrival date & time: 07/21/24  0001     Patient presents with: Fatigue and Headache   Brandy Vasquez is a 48 y.o. female.   Patient is a 48 year old female presenting after a syncopal type episode.  According to family members, patient was in the garage where it was somewhat warm.  She began appearing pale and sweaty, then had an episode where she became dizzy and passed out.  Family members describe some tonic clonic type movements.  She had an episode of vomiting after this episode and seemed disoriented.  This lasted for several minutes, then seemed to gradually improve from there.  Patient describes a headache, but has no other complaints.  Family does describe a similar episode that occurred approximately 1 month ago, but this 1 was more severe.       Prior to Admission medications   Medication Sig Start Date End Date Taking? Authorizing Provider  folic acid  (FOLVITE ) 1 MG tablet Take 1 mg by mouth daily. 12/24/21   [provider]  gabapentin (NEURONTIN) 100 MG capsule Take 100 mg by mouth 2 (two) times daily as needed. 06/15/22   [provider]  levonorgestrel  (MIRENA ) 20 MCG/DAY IUD 1 each by Intrauterine route once.    [provider]  meclizine  (ANTIVERT ) 25 MG tablet Take 1 tablet (25 mg total) by mouth 3 (three) times daily as needed for dizziness. 01/16/24   Matthews, Jason J, MD  methotrexate (RHEUMATREX) 2.5 MG tablet Take 15 mg by mouth once a week. sunday 01/19/22   [provider]  REMICADE 100 MG injection Inject 100 mg into the vein every 8 (eight) weeks. Next dose Feb 23, 2023 Patient not taking: Reported on 07/12/2024 07/01/22   [provider]  Rimegepant Sulfate (NURTEC) 75 MG TBDP Take 1 tablet (75 mg total) by mouth daily as needed (Migraine). Can repeat in 24 hours if needed. 01/17/24   Matthews, Jason J, MD  sertraline  (ZOLOFT ) 50 MG  tablet Take 1 tablet (50 mg total) by mouth daily. 05/14/24   Matthews, Jason J, MD    Allergies: Bactrim [sulfamethoxazole-trimethoprim]    Review of Systems  All other systems reviewed and are negative.   Updated Vital Signs BP 114/82   Pulse 72   Temp 97.7 F (36.5 C) (Oral)   Resp 20   SpO2 98%   Physical Exam Vitals and nursing note reviewed.  Constitutional:      General: She is not in acute distress.    Appearance: She is well-developed. She is not diaphoretic.  HENT:     Head: Normocephalic and atraumatic.  Eyes:     General: No visual field deficit.    Extraocular Movements: Extraocular movements intact.     Pupils: Pupils are equal, round, and reactive to light.  Cardiovascular:     Rate and Rhythm: Normal rate and regular rhythm.     Heart sounds: No murmur heard.    No friction rub. No gallop.  Pulmonary:     Effort: Pulmonary effort is normal. No respiratory distress.     Breath sounds: Normal breath sounds. No wheezing.  Abdominal:     General: Bowel sounds are normal. There is no distension.     Palpations: Abdomen is soft.     Tenderness: There is no abdominal tenderness.  Musculoskeletal:        General: Normal range of motion.     Cervical back:  Normal range of motion and neck supple.  Skin:    General: Skin is warm and dry.  Neurological:     General: No focal deficit present.     Mental Status: She is alert and oriented to person, place, and time.     Cranial Nerves: No cranial nerve deficit, dysarthria or facial asymmetry.     (all labs ordered are listed, but only abnormal results are displayed) Labs Reviewed  COMPREHENSIVE METABOLIC PANEL WITH GFR - Abnormal; Notable for the following components:      Result Value   Potassium 3.4 (*)    Glucose, Bld 119 (*)    All other components within normal limits  CBC - Abnormal; Notable for the following components:   WBC 10.7 (*)    All other components within normal limits  URINALYSIS, ROUTINE  W REFLEX MICROSCOPIC  PREGNANCY, URINE  CBG MONITORING, ED    EKG: None  Radiology: No results found.   Procedures   Medications Ordered in the ED - No data to display                                  Medical Decision Making Amount and/or Complexity of Data Reviewed Labs: ordered. Radiology: ordered.   Patient is a 48 year old female presenting after a syncopal like episode.  She arrives here with stable vital signs and is afebrile.  Physical examination is unremarkable and patient is neurologically intact.  Laboratory studies obtained including CBC, basic metabolic panel, both of which are unremarkable.  CT scan of the head showing no acute process.  Exact etiology of these episodes she describes are unclear, but suspect a syncopal etiology.  I will have patient follow-up with neurology and return to the ER if symptoms worsen or change.     Final diagnoses:  None    ED Discharge Orders     None          Geroldine Berg, MD 07/21/24 [phone]

## 2024-07-21 NOTE — Discharge Instructions (Signed)
 Drink plenty of fluids and get plenty of rest.  Follow-up with neurology in the next week.  The contact information for [facility] neurology has been provided in this discharge summary to call and make these arrangements.  Return to the ER in the meantime if symptoms worsen or change.

## 2024-07-21 NOTE — ED Notes (Signed)
 Patient transported to CT

## 2024-07-22 ENCOUNTER — Encounter: Payer: Self-pay | Admitting: Family Medicine

## 2024-07-22 ENCOUNTER — Ambulatory Visit: Payer: Self-pay | Admitting: Family Medicine

## 2024-07-23 NOTE — Telephone Encounter (Signed)
 FYI

## 2024-08-07 ENCOUNTER — Encounter: Payer: Self-pay | Admitting: Neurology

## 2024-08-07 ENCOUNTER — Ambulatory Visit: Admitting: Neurology

## 2024-08-07 VITALS — BP 128/86 | HR 99 | Resp 16 | Ht 66.0 in | Wt 210.5 lb

## 2024-08-07 DIAGNOSIS — R55 Syncope and collapse: Secondary | ICD-10-CM

## 2024-08-07 NOTE — Patient Instructions (Signed)
 Routine EEG, if normal we will obtain an ambulatory EEG  Continue current medications  Continue to follow up with  PCP  Please call if you do have another event

## 2024-08-07 NOTE — Progress Notes (Unsigned)
 [facility]  PATIENT: Brandy Vasquez DOB: [birth year]  REQUESTING CLINICIAN: Geroldine Berg, MD HISTORY FROM: Patient/Boyfriend and chart review  REASON FOR VISIT: Syncope vs. seizure   HISTORICAL  CHIEF COMPLAINT:  Chief Complaint  Patient presents with   New Patient (Initial Visit)    Rm12, alnoe, NP Internal referral for episode of syncope. Pt stated that she had 2 episodes in September and felt them coming on   HISTORY OF PRESENT ILLNESS:  Discussed the use of AI scribe software for clinical note transcription with the patient, who gave verbal consent to proceed.  Somaya Grassi is a 48 year old female with rheumatoid arthritis, depression who presents with episodes of altered consciousness and dizziness.  She experienced two episodes of altered consciousness in September. The first episode occurred in early September while sitting in her garage, where she experienced sweating, dizziness, and her head went back. Her boyfriend noted slight shaking and apparent loss of consciousness, although she remembers the event. She felt extremely tired the following day.  The second episode occurred on September 27th, again while sitting in the garage. This episode was more intense, with symptoms of tunnel vision, cold sweat, and vomiting. She went to the emergency room where blood work was performed, but no abnormalities were found. During this episode, her boyfriend observed her eyes rolling back and a delay in her responses. She experienced shaking and appeared weak, similar to a bad hangover. This episode lasted longer than the first, and she was taken to the hospital afterward.  She has a history of rheumatoid arthritis and was receiving infusions every eight weeks, which she has recently stopped due to financial constraints. She is also on anxiety medication and has experienced dizzy spells for a while, sometimes feeling as if something in her head is dropping. She recalls  having a seizure at the age of 25 or 23, which her mother attributed to being 'wormy'.  Her current medications include Zoloft  and meclizine , and she uses Nurtec as needed for headaches. She has not taken gabapentin in a while. No family history of seizures and no recent head injuries. She reports feeling overwhelmed and stressed, which she wonders might contribute to her symptoms.    OTHER MEDICAL CONDITIONS: Rheumatoid arthritis, Anxiety    REVIEW OF SYSTEMS: Full 14 system review of systems performed and negative with exception of: As noted in the HPI   ALLERGIES: Allergies  Allergen Reactions   Sulfamethoxazole-Trimethoprim Rash, Dermatitis and Other (See Comments)    Mouth sores    HOME MEDICATIONS: Outpatient Medications Prior to Visit  Medication Sig Dispense Refill   folic acid  (FOLVITE ) 1 MG tablet Take 1 mg by mouth daily.     gabapentin (NEURONTIN) 100 MG capsule Take 100 mg by mouth 2 (two) times daily as needed.     levonorgestrel  (MIRENA ) 20 MCG/DAY IUD 1 each by Intrauterine route once.     meclizine  (ANTIVERT ) 25 MG tablet Take 1 tablet (25 mg total) by mouth 3 (three) times daily as needed for dizziness. 30 tablet 0   methotrexate (RHEUMATREX) 2.5 MG tablet Take 15 mg by mouth once a week. sunday     REMICADE 100 MG injection Inject 100 mg into the vein every 8 (eight) weeks. Next dose Feb 23, 2023     Rimegepant Sulfate (NURTEC) 75 MG TBDP Take 1 tablet (75 mg total) by mouth daily as needed (Migraine). Can repeat in 24 hours if needed. 8 tablet 0   sertraline  (ZOLOFT ) 50  MG tablet Take 1 tablet (50 mg total) by mouth daily. 90 tablet 0   No facility-administered medications prior to visit.    PAST MEDICAL HISTORY: Past Medical History:  Diagnosis Date   Dysplasia of cervix, low grade (CIN 1)    GERD (gastroesophageal reflux disease)    Herpes    Hypertension 05/15/2021   on lisinopril /hctz10/12.5 for about a month only   Rheumatoid arthritis (HCC)      PAST SURGICAL HISTORY: Past Surgical History:  Procedure Laterality Date   BREAST BIOPSY Left 2016   COLONOSCOPY WITH PROPOFOL  N/A 02/14/2023   Procedure: COLONOSCOPY WITH PROPOFOL ;  Surgeon: Unk Corinn Skiff, MD;  Location: [facility];  Service: Gastroenterology;  Laterality: N/A;   INTRAUTERINE DEVICE (IUD) INSERTION N/A 01/31/2023   Procedure: INTRAUTERINE DEVICE (IUD) INSERTION;  Surgeon: Janit Alm Agent, MD;  Location: [facility];  Service: Gynecology;  Laterality: N/A;   IUD REMOVAL N/A 01/31/2023   Procedure: INTRAUTERINE DEVICE (IUD) REMOVAL;  Surgeon: Janit Alm Agent, MD;  Location: [facility];  Service: Gynecology;  Laterality: N/A;   LEEP N/A 01/31/2023   Procedure: LOOP ELECTROSURGICAL EXCISION PROCEDURE (LEEP);  Surgeon: Janit Alm Agent, MD;  Location: [facility];  Service: Gynecology;  Laterality: N/A;    FAMILY HISTORY: Family History  Problem Relation Age of Onset   Hypertension Mother    Diabetes Mother    Rheum arthritis Mother    Cancer Father    ADD / ADHD Daughter    ADD / ADHD Daughter    Alzheimer's disease Maternal Grandmother    Stroke Maternal Grandfather    Ovarian cancer Paternal Grandmother     SOCIAL HISTORY: Social History   Socioeconomic History   Marital status: Single    Spouse name: Not on file   Number of children: 2   Years of education: 12   Highest education level: GED or equivalent  Occupational History   Not on file  Tobacco Use   Smoking status: Never   Smokeless tobacco: Never  Vaping Use   Vaping status: Former   Substances: CBD  Substance and Sexual Activity   Alcohol use: Yes    Alcohol/week: 2.0 standard drinks of alcohol    Types: 2 Standard drinks or equivalent per week   Drug use: Not Currently   Sexual activity: Yes    Partners: Male    Birth control/protection: I.U.D.    Comment: Mirena   Other Topics Concern   Not on file  Social History Narrative   Not on file   Social Drivers of Health    Financial Resource Strain: Low Risk  (07/12/2024)   Overall Financial Resource Strain (CARDIA)    Difficulty of Paying Living Expenses: Not hard at all  Food Insecurity: No Food Insecurity (07/12/2024)   Hunger Vital Sign    Worried About Running Out of Food in the Last Year: Never true    Ran Out of Food in the Last Year: Never true  Transportation Needs: No Transportation Needs (07/12/2024)   PRAPARE - Administrator, Civil Service (Medical): No    Lack of Transportation (Non-Medical): No  Physical Activity: Unknown (07/12/2024)   Exercise Vital Sign    Days of Exercise per Week: 3 days    Minutes of Exercise per Session: Patient declined  Stress: Stress Concern Present (07/12/2024)   [employer] of Occupational Health - Occupational Stress Questionnaire    Feeling of Stress: To some extent  Social Connections: Moderately Integrated (07/12/2024)   Social  Connection and Isolation Panel    Frequency of Communication with Friends and Family: More than three times a week    Frequency of Social Gatherings with Friends and Family: Twice a week    Attends Religious Services: 1 to 4 times per year    Active Member of [employer] or Organizations: No    Attends Banker Meetings: Not on file    Marital Status: Living with partner  Intimate Partner Violence: Not At Risk (07/12/2022)   Humiliation, Afraid, Rape, and Kick questionnaire    Fear of Current or Ex-Partner: No    Emotionally Abused: No    Physically Abused: No    Sexually Abused: No    PHYSICAL EXAM  GENERAL EXAM/CONSTITUTIONAL: Vitals:  Vitals:   08/07/24 0959  BP: 128/86  Pulse: 99  Resp: 16  SpO2: 99%  Weight: 210 lb 8 oz (95.5 kg)  Height: 5' 6 (1.676 m)   Body mass index is 33.98 kg/m. Wt Readings from Last 3 Encounters:  08/07/24 210 lb 8 oz (95.5 kg)  07/12/24 206 lb 12.8 oz (93.8 kg)  03/13/24 214 lb (97.1 kg)   Patient is in no distress; well developed, nourished and groomed; neck  is supple  MUSCULOSKELETAL: Gait, strength, tone, movements noted in Neurologic exam below  NEUROLOGIC: MENTAL STATUS:      No data to display         awake, alert, oriented to person, place and time recent and remote memory intact normal attention and concentration language fluent, comprehension intact, naming intact fund of knowledge appropriate  CRANIAL NERVE:  2nd, 3rd, 4th, 6th - Visual fields full to confrontation, extraocular muscles intact, no nystagmus 5th - facial sensation symmetric 7th - facial strength symmetric 8th - hearing intact 9th - palate elevates symmetrically, uvula midline 11th - shoulder shrug symmetric 12th - tongue protrusion midline  MOTOR:  normal bulk and tone, full strength in the BUE, BLE  SENSORY:  normal and symmetric to light touch  COORDINATION:  finger-nose-finger, fine finger movements normal  GAIT/STATION:  normal    DIAGNOSTIC DATA (LABS, IMAGING, TESTING) - I reviewed patient records, labs, notes, testing and imaging myself where available.  Lab Results  Component Value Date   WBC 10.7 (H) 07/21/2024   HGB 13.3 07/21/2024   HCT 39.4 07/21/2024   MCV 96.6 07/21/2024   PLT 265 07/21/2024      Component Value Date/Time   NA 140 07/21/2024 0016   NA 141 07/12/2024 1134   K 3.4 (L) 07/21/2024 0016   CL 104 07/21/2024 0016   CO2 24 07/21/2024 0016   GLUCOSE 119 (H) 07/21/2024 0016   BUN 9 07/21/2024 0016   BUN 10 07/12/2024 1134   CREATININE 0.89 07/21/2024 0016   CREATININE 0.90 04/07/2021 0905   CALCIUM 9.1 07/21/2024 0016   PROT 7.2 07/21/2024 0016   PROT 7.0 07/12/2024 1134   ALBUMIN 4.5 07/21/2024 0016   ALBUMIN 4.5 07/12/2024 1134   AST 18 07/21/2024 0016   ALT 12 07/21/2024 0016   ALKPHOS 108 07/21/2024 0016   BILITOT 1.1 07/21/2024 0016   BILITOT 1.0 07/12/2024 1134   GFRNONAA >60 07/21/2024 0016   GFRNONAA 78 04/07/2021 0905   GFRAA 91 04/07/2021 0905   Lab Results  Component Value Date   CHOL  157 07/12/2024   HDL 43 07/12/2024   LDLCALC 103 (H) 07/12/2024   TRIG 56 07/12/2024   CHOLHDL 3.7 07/12/2024   Lab Results  Component Value Date  HGBA1C 4.8 07/12/2024   Lab Results  Component Value Date   VITAMINB12 647 02/25/2023   Lab Results  Component Value Date   TSH 0.826 07/12/2024    Head CT 07/21/2024 Normal noncontrast Head CT    ASSESSMENT AND PLAN  48 y.o. year old female with history of rheumatoid arthritis, migraines and anxiety who present with syncope vs. seizure  Recurrent episodes of syncope with altered awareness and shaking Two episodes in September with dizziness, sweating, and altered awareness while sitting. The first episode lasted 15-20 minutes with recovery allowing standing and talking. The second episode was more intense, with tunnel vision, vomiting, and prolonged weakness. Differential diagnosis includes seizures, syncope due to cardiac or autonomic causes, and non-epileptic events. Previous head CT was normal. - Order EEG in the office - Consider home study EEG if office EEG is normal - Restrict driving until further evaluation - Advise to record episodes if safe - Ensure adequate hydration  Vertigo Intermittent episodes of dizziness described as feeling off balance, without room spinning sensation. No associated nausea. Previous migraine medication was not helpful. Differential includes vestibular migraine or other non-specific dizziness. - Continue current medications as needed    1. Syncope, unspecified syncope type     Patient Instructions  Routine EEG, if normal we will obtain an ambulatory EEG  Continue current medications  Continue to follow up with  PCP  Please call if you do have another event and return sooner if worse   Orders Placed This Encounter  Procedures   EEG adult    No orders of the defined types were placed in this encounter.   Return if symptoms worsen or fail to improve.    Pastor Falling, MD  08/08/2024, 5:14 PM  [facility] [address], [address] [place], [place] [address] [phone]

## 2024-08-11 ENCOUNTER — Other Ambulatory Visit: Payer: Self-pay | Admitting: Family Medicine

## 2024-08-11 DIAGNOSIS — F419 Anxiety disorder, unspecified: Secondary | ICD-10-CM

## 2024-08-13 NOTE — Telephone Encounter (Signed)
 Requested Prescriptions  Pending Prescriptions Disp Refills   sertraline  (ZOLOFT ) 50 MG tablet [Pharmacy Med Name: SERTRALINE  HCL 50 MG TABLET] 90 tablet 0    Sig: TAKE 1 TABLET BY MOUTH EVERY DAY     Psychiatry:  Antidepressants - SSRI - sertraline  Passed - 08/13/2024  4:24 PM      Passed - AST in normal range and within 360 days    AST  Date Value Ref Range Status  07/21/2024 18 15 - 41 U/L Final         Passed - ALT in normal range and within 360 days    ALT  Date Value Ref Range Status  07/21/2024 12 0 - 44 U/L Final         Passed - Completed PHQ-2 or PHQ-9 in the last 360 days      Passed - Valid encounter within last 6 months    Recent Outpatient Visits           1 month ago Near syncope   [facility], Selinda PARAS, MD   3 months ago Anxiety   [facility] at [facility], Selinda PARAS, MD   5 months ago Dermatitis   [facility] at [facility], Selinda PARAS, MD   7 months ago Dizziness   [facility] at [facility], Selinda PARAS, MD       Future Appointments             Tomorrow Ku, Selinda PARAS, MD [facility] at [facility], [phone] [place]

## 2024-08-14 ENCOUNTER — Ambulatory Visit (INDEPENDENT_AMBULATORY_CARE_PROVIDER_SITE_OTHER): Admitting: Family Medicine

## 2024-08-14 ENCOUNTER — Ambulatory Visit: Attending: Family Medicine

## 2024-08-14 VITALS — BP 126/80 | HR 78 | Temp 98.3°F | Ht 66.0 in | Wt 209.6 lb

## 2024-08-14 DIAGNOSIS — G43801 Other migraine, not intractable, with status migrainosus: Secondary | ICD-10-CM

## 2024-08-14 DIAGNOSIS — R55 Syncope and collapse: Secondary | ICD-10-CM | POA: Diagnosis not present

## 2024-08-14 DIAGNOSIS — F419 Anxiety disorder, unspecified: Secondary | ICD-10-CM

## 2024-08-14 DIAGNOSIS — G43909 Migraine, unspecified, not intractable, without status migrainosus: Secondary | ICD-10-CM | POA: Insufficient documentation

## 2024-08-14 DIAGNOSIS — M0579 Rheumatoid arthritis with rheumatoid factor of multiple sites without organ or systems involvement: Secondary | ICD-10-CM

## 2024-08-14 MED ORDER — SERTRALINE HCL 100 MG PO TABS
100.0000 mg | ORAL_TABLET | Freq: Every day | ORAL | 0 refills | Status: DC
Start: 2024-08-14 — End: 2024-08-21

## 2024-08-14 NOTE — Assessment & Plan Note (Signed)
 Migraine - Uses Nurtec for migraines - Migraines have been stable

## 2024-08-14 NOTE — Assessment & Plan Note (Signed)
 Rheumatologic condition Managed with methotrexate and Remicade infusions. Concerns about missed infusions and potential impact on symptoms. - Continue methotrexate as prescribed. - Consult with rheumatology regarding Remicade infusion schedule and any missed doses.

## 2024-08-14 NOTE — Assessment & Plan Note (Signed)
 Psychological stress - Experiencing significant stress, particularly related to family issues - Caring for children and a one-year-old grandchild at home, which is overwhelming  Fatigue - Struggles with significant fatigue upon waking - Difficulty getting out of bed due to feeling 'real fatigued'  Menstrual and hormonal factors - All three episodes coincided with menstrual cycle - Menstrual cycles are irregular - Recently started using a new Mirena  IUD - Questions if episodes could be related to hormonal changes  Generalized anxiety disorder Increased stress due to familial issues. Current sertraline  50 mg resulted in initial improvement, but environmental stress levels have increased. Discussed caretaker's fatigue and self-care importance. CBT recommended. Discussed potential hormonal influence on symptoms. - Increase sertraline  to 100 mg daily. - Refer to cognitive behavioral therapy. - Advise to monitor for any worsening of symptoms with the new dose. - Encourage non-substance-based relaxation techniques. - Advise follow-up with GYN if needed.

## 2024-08-14 NOTE — Assessment & Plan Note (Signed)
 Chest discomfort and palpitations - Recurrent episodes of near-syncope, prior syncope with seizure-type behavior, has seen neurology and their workup ongoing (EEG pending) - Had another, this time varied from typical pattern episode described as chest discomfort and palpitations, most recently on the morning of October 20th - Describes sensation as 'heart racing' with a heavy feeling on the chest, which was new compared to prior episode - No diaphoresis, but experienced a sensation of feeling hot - Episode lasted 5-10 minutes - Remained conscious, found it more tolerable to close eyes and lean head back during the episode - Recent episode felt weaker than previous two  Associated neurological and autonomic symptoms - Previous episode on September 27th was more intense, with tunnel vision, cold sweats, and vomiting - First episode included sweating, dizziness, and apparent loss of consciousness  Recurrent episodes of chest discomfort, palpitations, and syncope-like symptoms Recent episoide characterized by heart racing, chest heaviness. Differential includes cardiologic and stress-related causes. Neurological workup ongoing. Stress may contribute. Neurology involved to rule out seizure disorder. Cardiologic evaluation warranted. - Order Holter monitor (Zio patch) to assess heart rhythm during episodes. - Refer to cardiology for further evaluation. - Order echocardiogram to assess heart structure and function. - Instruct to report the recent episode to neurology via MyChart.

## 2024-08-14 NOTE — Progress Notes (Signed)
 Primary Care / Sports Medicine Office Visit  Patient Information:  Patient ID: Brandy Vasquez, female DOB: 07/28/1976 Age: 48 y.o. MRN: [id number]   Brandy Vasquez is a pleasant 48 y.o. female presenting with the following:  Chief Complaint  Patient presents with   Annual Exam    Patient presents today for her annual exam. She is feeling a little sluggish today. She had another dizzy spell yesterday she would liked to discuss with the doctor.     Vitals:   08/14/24 0851  BP: 126/80  Pulse: 78  Temp: 98.3 F (36.8 C)  SpO2: 95%   Vitals:   08/14/24 0851  Weight: 209 lb 9.6 oz (95.1 kg)  Height: 5' 6 (1.676 m)   Body mass index is 33.83 kg/m.  CT Head Wo Contrast Result Date: 07/21/2024 CLINICAL DATA:  48 year old female with headache, lethargy, vomiting. EXAM: CT HEAD WITHOUT CONTRAST TECHNIQUE: Contiguous axial images were obtained from the base of the skull through the vertex without intravenous contrast. RADIATION DOSE REDUCTION: This exam was performed according to the departmental dose-optimization program which includes automated exposure control, adjustment of the mA and/or kV according to patient size and/or use of iterative reconstruction technique. COMPARISON:  None Available. FINDINGS: Brain: Normal cerebral volume. No midline shift, ventriculomegaly, mass effect, evidence of mass lesion, intracranial hemorrhage or evidence of cortically based acute infarction. Gray-white matter differentiation is within normal limits throughout the brain. Faint basal ganglia vascular calcification primarily on left. Vascular: No suspicious intracranial vascular hyperdensity. Skull: Intact, negative. Sinuses/Orbits: Visualized paranasal sinuses and mastoids are clear. Other: Visualized orbits and scalp soft tissues are within normal limits. IMPRESSION: Normal noncontrast Head CT. Electronically Signed   By: VEAR Hurst M.D.   On: 07/21/2024 03:53     Discussed the use of AI scribe software for  clinical note transcription with the patient, who gave verbal consent to proceed.   Independent interpretation of notes and tests performed by another provider:   None  Procedures performed:   None  Pertinent History, Exam, Impression, and Recommendations:   Problem List Items Addressed This Visit     Anxiety   Psychological stress - Experiencing significant stress, particularly related to family issues - Caring for children and a one-year-old grandchild at home, which is overwhelming  Fatigue - Struggles with significant fatigue upon waking - Difficulty getting out of bed due to feeling 'real fatigued'  Menstrual and hormonal factors - All three episodes coincided with menstrual cycle - Menstrual cycles are irregular - Recently started using a new Mirena  IUD - Questions if episodes could be related to hormonal changes  Generalized anxiety disorder Increased stress due to familial issues. Current sertraline  50 mg resulted in initial improvement, but environmental stress levels have increased. Discussed caretaker's fatigue and self-care importance. CBT recommended. Discussed potential hormonal influence on symptoms. - Increase sertraline  to 100 mg daily. - Refer to cognitive behavioral therapy. - Advise to monitor for any worsening of symptoms with the new dose. - Encourage non-substance-based relaxation techniques. - Advise follow-up with GYN if needed.      Relevant Medications   sertraline  (ZOLOFT ) 100 MG tablet   Other Relevant Orders   Ambulatory referral to Psychology   Migraine   Migraine - Uses Nurtec for migraines - Migraines have been stable      Relevant Medications   sertraline  (ZOLOFT ) 100 MG tablet   Near syncope - Primary   Chest discomfort and palpitations - Recurrent episodes of near-syncope, prior syncope  with seizure-type behavior, has seen neurology and their workup ongoing (EEG pending) - Had another, this time varied from typical pattern  episode described as chest discomfort and palpitations, most recently on the morning of October 20th - Describes sensation as 'heart racing' with a heavy feeling on the chest, which was new compared to prior episode - No diaphoresis, but experienced a sensation of feeling hot - Episode lasted 5-10 minutes - Remained conscious, found it more tolerable to close eyes and lean head back during the episode - Recent episode felt weaker than previous two  Associated neurological and autonomic symptoms - Previous episode on September 27th was more intense, with tunnel vision, cold sweats, and vomiting - First episode included sweating, dizziness, and apparent loss of consciousness  Recurrent episodes of chest discomfort, palpitations, and syncope-like symptoms Recent episoide characterized by heart racing, chest heaviness. Differential includes cardiologic and stress-related causes. Neurological workup ongoing. Stress may contribute. Neurology involved to rule out seizure disorder. Cardiologic evaluation warranted. - Order Holter monitor (Zio patch) to assess heart rhythm during episodes. - Refer to cardiology for further evaluation. - Order echocardiogram to assess heart structure and function. - Instruct to report the recent episode to neurology via MyChart.        Relevant Orders   LONG TERM MONITOR (3-14 DAYS)   ECHOCARDIOGRAM COMPLETE   Ambulatory referral to Cardiology   Rheumatoid arthritis involving multiple sites with positive rheumatoid factor (HCC)   Rheumatologic condition Managed with methotrexate and Remicade infusions. Concerns about missed infusions and potential impact on symptoms. - Continue methotrexate as prescribed. - Consult with rheumatology regarding Remicade infusion schedule and any missed doses.      A total of 41 minutes was spent on the date of service, 08/14/2024, encompassing both face-to-face and non-face-to-face time. This included review of prior records  and imaging (e.g., MRI and/or radiographs), medical chart review, information gathering, documentation, care coordination with clinic staff, discussion and counseling with the patient regarding clinical findings and treatment options, and planning for follow-up and next steps in management.   Orders & Medications Medications:  Meds ordered this encounter  Medications   sertraline  (ZOLOFT ) 100 MG tablet    Sig: Take 1 tablet (100 mg total) by mouth daily.    Dispense:  60 tablet    Refill:  0   Orders Placed This Encounter  Procedures   Ambulatory referral to Cardiology   Ambulatory referral to Psychology   LONG TERM MONITOR (3-14 DAYS)   ECHOCARDIOGRAM COMPLETE     Return in about 4 weeks (around 09/11/2024) for 4-6 WEEKS, CHANGE TO CPE.     [name], MD, [facility]   Primary Care Sports Medicine Primary Care and Sports Medicine at [facility]

## 2024-08-14 NOTE — Patient Instructions (Signed)
 VISIT SUMMARY:  During your visit, we discussed your recurrent episodes of chest discomfort and palpitations, as well as your increased stress and fatigue. We also reviewed your current treatment for migraines and your rheumatologic condition.  YOUR PLAN:  RECURRENT EPISODES OF SYNCOPE-LIKE SYMPTOMS: You have experienced episodes of heart racing, chest heaviness. These episodes may be related to your heart or stress. -We will use a Holter monitor (Zio patch) to check your heart rhythm during these episodes. -You will be referred to a cardiologist for further evaluation. -An echocardiogram will be done to assess your heart's structure and function. -Please report the recent episode to neurology via MyChart.  GENERALIZED ANXIETY: You are experiencing increased stress due to family issues, which may be affecting your overall health. -Your sertraline  dose will be increased to 100 mg daily. -You will be referred to cognitive behavioral therapy (CBT). -Monitor for any worsening of symptoms with the new dose of sertraline . -Use non-substance-based relaxation techniques. -Follow up with your gynecologist if needed.  RHEUMATOLOGIC CONDITION: You are managing a rheumatologic condition with methotrexate and Remicade, but have concerns about missed infusions. -Continue taking methotrexate as prescribed. -Consult with your rheumatologist regarding your Remicade infusion schedule and any missed doses.

## 2024-08-20 ENCOUNTER — Encounter: Payer: Self-pay | Admitting: Family Medicine

## 2024-08-21 ENCOUNTER — Other Ambulatory Visit: Payer: Self-pay | Admitting: Family Medicine

## 2024-08-21 DIAGNOSIS — F419 Anxiety disorder, unspecified: Secondary | ICD-10-CM

## 2024-08-21 MED ORDER — SERTRALINE HCL 50 MG PO TABS
50.0000 mg | ORAL_TABLET | Freq: Every day | ORAL | 0 refills | Status: AC
Start: 2024-08-21 — End: ?

## 2024-08-21 NOTE — Telephone Encounter (Signed)
 Please review and advise patient.   JM

## 2024-08-22 ENCOUNTER — Ambulatory Visit: Payer: Self-pay | Admitting: Neurology

## 2024-08-22 ENCOUNTER — Ambulatory Visit (INDEPENDENT_AMBULATORY_CARE_PROVIDER_SITE_OTHER): Admitting: Neurology

## 2024-08-22 DIAGNOSIS — R55 Syncope and collapse: Secondary | ICD-10-CM

## 2024-08-22 NOTE — Procedures (Signed)
    History:  48 year old woman with syncope   EEG classification: Awake and drowsy  Duration: 25 minutes   Technical aspects: This EEG study was done with scalp electrodes positioned according to the 10-20 International system of electrode placement. Electrical activity was reviewed with band pass filter of 1-70Hz , sensitivity of 7 uV/mm, display speed of 33mm/sec with a 60Hz  notched filter applied as appropriate. EEG data were recorded continuously and digitally stored.   Description of the recording: The background rhythms of this recording consists of a fairly well modulated medium amplitude alpha rhythm of 10 Hz that is reactive to eye opening and closure. Present in the anterior head region is a 15-20 Hz beta activity. Photic stimulation was performed, did not show any abnormalities. Hyperventilation was also performed, did not show any abnormalities. Drowsiness was manifested by background fragmentation. No abnormal epileptiform discharges seen during this recording. There was no focal slowing. There were no electrographic seizure identified.   Abnormality: None   Impression: This is a normal awake and drowsy EEG. No evidence of interictal epileptiform discharges. Normal EEGs, however, do not rule out epilepsy.    Mossie Gilder, MD [facility]

## 2024-08-23 ENCOUNTER — Telehealth: Payer: Self-pay | Admitting: *Deleted

## 2024-08-23 NOTE — Telephone Encounter (Signed)
 Sent secure email with orders/notes to [facility] to schedule pt for in-home 72-hour EEG.

## 2024-09-06 DIAGNOSIS — R4182 Altered mental status, unspecified: Secondary | ICD-10-CM | POA: Diagnosis not present

## 2024-09-06 DIAGNOSIS — R55 Syncope and collapse: Secondary | ICD-10-CM

## 2024-09-10 ENCOUNTER — Ambulatory Visit: Payer: Self-pay | Admitting: Family Medicine

## 2024-09-10 DIAGNOSIS — R55 Syncope and collapse: Secondary | ICD-10-CM | POA: Diagnosis not present

## 2024-09-10 NOTE — Telephone Encounter (Signed)
 Received update from [facility] that pt scheduled 08/29/24/testing completed. Results pending.

## 2024-09-11 ENCOUNTER — Ambulatory Visit
Admission: RE | Admit: 2024-09-11 | Discharge: 2024-09-11 | Disposition: A | Discharge status: Home / Self Care | Source: Ambulatory Visit | Attending: Family Medicine | Admitting: Family Medicine

## 2024-09-11 DIAGNOSIS — R55 Syncope and collapse: Secondary | ICD-10-CM | POA: Insufficient documentation

## 2024-09-11 DIAGNOSIS — I340 Nonrheumatic mitral (valve) insufficiency: Secondary | ICD-10-CM | POA: Insufficient documentation

## 2024-09-11 LAB — ECHOCARDIOGRAM COMPLETE
AR max vel: 2.37 cm2
AV Area VTI: 2.39 cm2
AV Area mean vel: 2.42 cm2
AV Mean grad: 3 mmHg
AV Peak grad: 5.1 mmHg
Ao pk vel: 1.13 m/s
Area-P 1/2: 4.15 cm2
Calc EF: 64 %
MV VTI: 2.77 cm2
S' Lateral: 3.3 cm
Single Plane A2C EF: 62.1 %
Single Plane A4C EF: 63.9 %

## 2024-09-12 ENCOUNTER — Ambulatory Visit (INDEPENDENT_AMBULATORY_CARE_PROVIDER_SITE_OTHER): Admitting: Family Medicine

## 2024-09-12 ENCOUNTER — Encounter: Payer: Self-pay | Admitting: Family Medicine

## 2024-09-12 VITALS — BP 124/80 | HR 80 | Ht 66.0 in | Wt 205.0 lb

## 2024-09-12 DIAGNOSIS — F419 Anxiety disorder, unspecified: Secondary | ICD-10-CM

## 2024-09-12 DIAGNOSIS — R002 Palpitations: Secondary | ICD-10-CM | POA: Diagnosis not present

## 2024-09-12 DIAGNOSIS — G4739 Other sleep apnea: Secondary | ICD-10-CM

## 2024-09-12 MED ORDER — BUSPIRONE HCL 5 MG PO TABS: 5.0000 mg | ORAL_TABLET | Freq: Two times a day (BID) | ORAL | 0 refills | Status: DC

## 2024-09-12 NOTE — Progress Notes (Signed)
 Primary Care / Sports Medicine Office Visit  Patient Information:  Patient ID: Brandy Vasquez, female DOB: [birth year] Age: 48 y.o. MRN: [id number]   Brandy Vasquez is a pleasant 48 y.o. female presenting with the following:  Chief Complaint  Patient presents with   Annual Exam    Vitals:   09/12/24 0921  BP: 124/80  Pulse: 80  SpO2: 95%   Vitals:   09/12/24 0921  Weight: 205 lb (93 kg)  Height: 5' 6 (1.676 m)   Body mass index is 33.09 kg/m.     Discussed the use of AI scribe software for clinical note transcription with the patient, who gave verbal consent to proceed.   Independent interpretation of notes and tests performed by another provider:   None  Procedures performed:   None  Pertinent History, Exam, Impression, and Recommendations:  History of Present Illness Brandy Vasquez is a 48 year old female who presents with palpitations and episodes of shortness of breath.  Palpitations and cardiac evaluation - Palpitations characterized by a sensation of something not feeling right in her heart - Episodes of palpitations correspond with ectopic beats on Holter monitor - Echocardiogram demonstrates normal heart function with ejection fraction of 50-55%, normal ventricular function, and no significant valve abnormalities - Mild tricuspid regurgitation present on echocardiogram - Cardiology appointment scheduled for further evaluation  Dyspnea - Episodes of shortness of breath, particularly during palpitations - Requires taking short breaths during these episodes  Neurological evaluation - No seizure-like episodes - CT scan of the head is normal - Awaiting results from a three-day home EEG to assess for seizure activity  Anxiety and pharmacologic management - Currently taking sertraline  50 mg for anxiety - Sertraline  initially effective but no longer provides symptom relief - Previous trial of sertraline  100 mg resulted in excessive sedation - Increased  anxiety symptoms and considering medication adjustment  Tobacco use - History of smoking, which may be impacting lung function  Physical Exam VITALS: SaO2- 95% CHEST: Lungs clear to auscultation, no wheezes or crackles.  Results RADIOLOGY CT head: Normal, no space-occupying lesions  DIAGNOSTIC Holter monitor: Harmless ectopic beats corresponding with symptoms Echocardiogram: Ejection fraction 50-55%, normal left ventricular wall motion, normal diastolic parameters, normal right ventricular systolic function, normal right ventricle size, inadequate tricuspid regurgitation signals for pulmonary artery pressure assessment, normal mitral valve with mild regurgitation, normal aortic valve, normal inferior vena cava size, right atrial pressure 3 mmHg  Assessment and Plan Generalized anxiety disorder Increased anxiety with GAD-7 score of 12. Current sertraline  dose insufficient, 100 mg caused sedation. - Initiated buspirone  5 mg BID, titrate by 5 mg every 2-3 days as needed, max 60 mg daily. - Scheduled video follow-up in 4 weeks to assess response.  Major depressive disorder, single episode, unspecified PHQ-9 score increased to 7, indicating mild depressive symptoms. Current sertraline  dose insufficient. - Continue sertraline  50 mg daily.  Palpitations due to benign ectopic beats Benign ectopic beats confirmed by Holter monitor. Symptoms not indicative of serious cardiac condition. - Continue follow-up with cardiology on November 24th.  Suspected sleep apnea Low oxygen saturation at 95%, potential nocturnal desaturation. Sleep apnea may contribute to stress and palpitations. - Referred to sleep medicine for home sleep study. Problem List Items Addressed This Visit     Anxiety - Primary   Relevant Medications   busPIRone  (BUSPAR ) 5 MG tablet   Palpitations   Sleep apnea-like behavior     Orders & Medications Medications:  Meds ordered this encounter  Medications   busPIRone   (BUSPAR ) 5 MG tablet    Sig: Take 1 tablet (5 mg total) by mouth 2 (two) times daily. Initial: 10 mg/day PO in 2 divided doses; increase every 3 days in increments of 5 mg/day to a maximum of 60 mg/day. Find lowest effective dose.    Dispense:  90 tablet    Refill:  0   No orders of the defined types were placed in this encounter.    Return in about 29 days (around 10/11/2024) for MyChart Video Visit.     [name], MD, [facility]   Primary Care Sports Medicine Primary Care and Sports Medicine at [facility]

## 2024-09-14 ENCOUNTER — Encounter (INDEPENDENT_AMBULATORY_CARE_PROVIDER_SITE_OTHER): Payer: Self-pay | Admitting: Neurology

## 2024-09-14 DIAGNOSIS — R55 Syncope and collapse: Secondary | ICD-10-CM

## 2024-09-14 NOTE — Procedures (Signed)
 Patient Name: Brandy Vasquez  MRN: [id number]  Referring Physician/Provider: Gregg  Study start date: 09/03/2024 at 0512 PM Study end date: 09/06/2024 at 1120 AM Duration: 66 hours    Clinical History:   56Y Female with rheumatoid arthritis, depression, and headaches presents for episodes of altered consciousness, and dizziness. Patient's episodes described as sweating, dizziness, and her head going back. Slight shaking, and apparent loss of consciousness were noted by her boyfriend. Most recent symptoms 06/2024 was more intense with symptoms of tunnel vision, cold sweat, and vomiting. Boyfriend describes eyes rolling back, delay in response, shaking and weak appearance like that of a bad hangover.   INTERMITTENT MONITORING with VIDEO TECHNICAL SUMMARY:  This AVEEG was performed using equipment provided by [employer] utilizing Bluetooth ( [employer] ) amplifiers with continuous EEGT attended video collection using encrypted remote transmission via [employer] secured cellular tower network with data rates for each AVEEG performed. This is a therapist, music AVEEG, obtained, according to the 10-20 international electrode placement system, reformatted digitally into referential and bipolar montages. Data was acquired with a minimum of 21 bipolar connections and sampled at a minimum rate of 250 cycles per second per channel, maximum rate of 450 cycles per second per channel and two channels for EKG. The entire VEEG study was recorded through cable and or radio telemetry for subsequent analysis. Specified epochs of the AVEEG data were identified at the direction of the subject by the depression of a push button by the patient. Each patients event file included data acquired two minutes prior to the push button activation and continuing until two minutes afterwards. AVEEG files were reviewed on [employer] server, Licensed Software provided by [employer] with a digital high frequency filter  set at 70 Hz and a low frequency filter set at 1 Hz with a paper speed of 55mm/s resulting in 10 seconds per digital page. This entire AVEEG was reviewed by the EEG Technologist. Random time samples, random sleep samples, clips, patient initiated push button files with included patient daily diary logs, EEG Technologist pruned data was reviewed and verified for accuracy and validity by the governing reading neurologist in full details. This AEEGV was fully compliant with all requirements for CPT 97500 for setup, patient education, take down and administered by an EEG technologist.   Long-Term EEG with Video was monitored intermittently by a qualified EEG technologist for the entirety of the recording; quality check-ins were performed at a minimum of every two hours, checking and documenting real-time data and video to assure the integrity and quality of the recording (e.g., camera position, electrode integrity and impedance), and identify the need for maintenance. For intermittent monitoring, an EEG Technologist monitored no more than 12 patients concurrently. Diagnostic video was captured at least 80% of the time during the recording.   PATIENT EVENTS:  There were no patient events noted or captured during this recording.   TECHNOLOGIST EVENTS:  No clear epileptiform activity was detected by the reviewing neurodiagnostic technologist for further review, but there was presence of intermittent left frontal slowing.   TIME SAMPLES:  10-minutes of every 2 hours recorded are reviewed as random time samples.   SLEEP SAMPLES:  5-minutes of every 24 hours recorded are reviewed as random sleep samples.   AWAKE:  At maximal level of alertness, the posterior dominant background activity was continuous, reactive, low voltage rhythm of 9 Hz. This was symmetric, well-modulated, and attenuated with eye opening. Diffuse, symmetric, frontocentral beta range activity was present.  SLEEP:  N1 Sleep (Stage 1) was  observed and characterized by the disappearance of alpha rhythm and the appearance of vertex activity.   N2 Sleep (Stage 2) was observed and characterized by vertex waves, K-complexes, and sleep spindles.   N3 (Stage 3) sleep was observed and characterized by high amplitude Delta activity of 20%.   REM sleep was observed.   EKG:  There were no arrhythmias or abnormalities noted during this recording.   Impression:  Abnormal EEG due to intermittent left frontal slowing.    Clinical Correlation:  This is an abnormal 3-day ambulatory EEG tracing due to intermittent left frontal slowing. This is suggestive of an area of neuronal dysfunction in the left frontal region. There were no electrographic seizures or epileptiform discharges noted. No events were captured during the recording.    Raynelle Fujikawa, MD [facility]

## 2024-09-15 NOTE — Progress Notes (Unsigned)
  Cardiology Office Note   Date:  09/17/2024  ID:  [name], DOB 1976-03-09, MRN [id number] PCP: Alvia Selinda PARAS, MD  [facility] Providers Cardiologist:  Caron Poser, MD     History of Present Illness Brandy Vasquez is a 48 y.o. female PMH rheumatoid arthritis who presents for further evaluation management of syncope.  Patient reports several recent episodes of near syncope without frank syncope.  She has also been having some palpitations.  Fortunately, she has had recent cardiac testing which was quite benign.  A monitor showed a 5% PVC burden which she notes has been symptomatic.  Basic labs including CBC, CMP, TSH, unremarkable.  Last LDL 103 06/2024.  Relevant CVD History -TTE 08/2024 LVEF 50 to 55%, normal RV size and function, no significant valvular disease - Zio monitor 09/10/2024 mean heart rate 77, 5.1% PVC burden, rare PACs, no arrhythmias, triggers corresponded to PVCs.   ROS: Pt denies any chest discomfort, jaw pain, arm pain, palpitations, syncope, presyncope, orthopnea, PND, or LE edema.  Studies Reviewed I have independently reviewed the patient's ECG, previous cardiac testing, recent medical records, recent blood work.  Physical Exam VS:  BP 136/80 (BP Location: Left Arm, Patient Position: Sitting, Cuff Size: Large)   Pulse 64   Ht 5' 6 (1.676 m)   Wt 206 lb (93.4 kg)   SpO2 98%   BMI 33.25 kg/m   Orthostatic VS for the past 24 hrs (Last 3 readings):  BP- Lying Pulse- Lying BP- Sitting Pulse- Sitting BP- Standing at 0 minutes Pulse- Standing at 0 minutes BP- Standing at 3 minutes Pulse- Standing at 3 minutes  09/17/24 0903 (!) 146/93 72 145/79 63 149/87 65 (!) 145/99 69      Wt Readings from Last 3 Encounters:  09/17/24 206 lb (93.4 kg)  09/12/24 205 lb (93 kg)  08/14/24 209 lb 9.6 oz (95.1 kg)    GEN: No acute distress. NECK: No JVD; No carotid bruits. CARDIAC: RRR, no murmurs, rubs, gallops. RESPIRATORY:  Clear to  auscultation. EXTREMITIES:  Warm and well-perfused. No edema.  ASSESSMENT AND PLAN Near syncope Frequent PVCs Patient reports episodes of dizziness and near syncope without frank syncope.  She had a an echocardiogram that was very reassuring with normal biventricular function and no valvular disease.  A recent monitor was also quite benign and only significant for a 5% PVC burden.  She notes that she is symptomatic from this.  Orthostatic vital signs in office were normal.  Overall, there is an unclear cause of her near syncopal episodes, but fortunately, there does not seem to be a clear malignant cardiogenic cause.  Orthostatic vital signs are also not suggestive of significant vasodepressive etiology or something such as POTS.  Recent labs were also not suggestive of an obvious metabolic etiology.  Plan: - Encouraged adequate hydration, minimizing stress, etc. - Will start metoprolol  25 mg XL for PVC suppression - No further cardiac treatment or testing indicated        Dispo: RTC as needed  Signed, Caron Poser, MD

## 2024-09-17 ENCOUNTER — Ambulatory Visit

## 2024-09-17 ENCOUNTER — Ambulatory Visit: Payer: Self-pay | Admitting: Neurology

## 2024-09-17 VITALS — BP 136/80 | HR 64 | Ht 66.0 in | Wt 206.0 lb

## 2024-09-17 DIAGNOSIS — R55 Syncope and collapse: Secondary | ICD-10-CM

## 2024-09-17 DIAGNOSIS — I493 Ventricular premature depolarization: Secondary | ICD-10-CM | POA: Diagnosis not present

## 2024-09-17 MED ORDER — METOPROLOL SUCCINATE ER 25 MG PO TB24: 25.0000 mg | ORAL_TABLET | Freq: Every day | ORAL | 3 refills | Status: AC

## 2024-09-17 NOTE — Patient Instructions (Addendum)
 Medication Instructions:   Your physician has recommended you make the following change in your medication:   START- Metoprolol  Succinate- Take 1 (25 mg)  tablet by mouth once daily.  *If you need a refill on your cardiac medications before your next appointment, please call your pharmacy*  Lab Work:  NONE  If you have labs (blood work) drawn today and your tests are completely normal, you will receive your results only by: MyChart Message (if you have MyChart) OR A paper copy in the mail If you have any lab test that is abnormal or we need to change your treatment, we will call you to review the results.  Testing/Procedures:  NONE  Follow-Up: At [facility], you and your health needs are our priority.  As part of our continuing mission to provide you with exceptional heart care, our providers are all part of one team.  This team includes your primary Cardiologist (physician) and Advanced Practice Providers or APPs (Physician Assistants and Nurse Practitioners) who all work together to provide you with the care you need, when you need it.  Your next appointment:   As needed   Provider:   You may see Caron Poser, MD or one of the following Advanced Practice Providers on your designated Care Team:   Lonni Meager, NP Lesley Maffucci, PA-C Bernardino Bring, PA-C Cadence Parks, PA-C Tylene Lunch, NP Barnie Hila, NP  We recommend signing up for the patient portal called MyChart.  Sign up information is provided on this After Visit Summary.  MyChart is used to connect with patients for Virtual Visits (Telemedicine).  Patients are able to view lab/test results, encounter notes, upcoming appointments, etc.  Non-urgent messages can be sent to your provider as well.   To learn more about what you can do with MyChart, go to [email or web address].

## 2024-09-19 DIAGNOSIS — R002 Palpitations: Secondary | ICD-10-CM | POA: Insufficient documentation

## 2024-09-19 DIAGNOSIS — G4739 Other sleep apnea: Secondary | ICD-10-CM | POA: Insufficient documentation

## 2024-09-19 NOTE — Patient Instructions (Signed)
 VISIT SUMMARY:  During your visit, we discussed your palpitations, shortness of breath, anxiety, and potential sleep apnea. We have made some adjustments to your medications and scheduled follow-up appointments to further evaluate your symptoms.  YOUR PLAN:  GENERALIZED ANXIETY DISORDER: You have increased anxiety symptoms, and your current dose of sertraline  is not sufficient. -Start taking buspirone  5 mg twice a day. You can increase the dose by 5 mg every 2-3 days as needed, up to a maximum of 60 mg daily. -We will have a video follow-up in 4 weeks to see how you are responding to the new medication.  MAJOR DEPRESSIVE DISORDER, SINGLE EPISODE, UNSPECIFIED: You have mild depressive symptoms, and your current dose of sertraline  is not sufficient. -Continue taking sertraline  50 mg daily.  PALPITATIONS DUE TO BENIGN ECTOPIC BEATS: Your palpitations are due to benign ectopic beats, which are not indicative of a serious heart condition. -Continue with your scheduled follow-up with cardiology on November 24th.  SUSPECTED SLEEP APNEA: You have low oxygen saturation, which may indicate sleep apnea and could be contributing to your stress and palpitations. -You are referred to sleep medicine for a home sleep study.

## 2024-10-05 ENCOUNTER — Other Ambulatory Visit: Payer: Self-pay | Admitting: Family Medicine

## 2024-10-07 ENCOUNTER — Encounter: Payer: Self-pay | Admitting: Family Medicine

## 2024-10-08 MED ORDER — BUSPIRONE HCL 5 MG PO TABS: 5.0000 mg | ORAL_TABLET | Freq: Two times a day (BID) | ORAL | 0 refills | Status: DC

## 2024-10-10 DIAGNOSIS — M0579 Rheumatoid arthritis with rheumatoid factor of multiple sites without organ or systems involvement: Secondary | ICD-10-CM | POA: Diagnosis not present

## 2024-10-10 DIAGNOSIS — Z7960 Long term (current) use of unspecified immunomodulators and immunosuppressants: Secondary | ICD-10-CM | POA: Diagnosis not present

## 2024-10-12 ENCOUNTER — Encounter: Payer: Self-pay | Admitting: Family Medicine

## 2024-10-12 ENCOUNTER — Telehealth: Admitting: Family Medicine

## 2024-10-12 DIAGNOSIS — R55 Syncope and collapse: Secondary | ICD-10-CM | POA: Diagnosis not present

## 2024-10-12 DIAGNOSIS — F419 Anxiety disorder, unspecified: Secondary | ICD-10-CM | POA: Diagnosis not present

## 2024-10-12 DIAGNOSIS — G43801 Other migraine, not intractable, with status migrainosus: Secondary | ICD-10-CM

## 2024-10-12 DIAGNOSIS — R002 Palpitations: Secondary | ICD-10-CM

## 2024-10-12 MED ORDER — BUSPIRONE HCL 15 MG PO TABS: 15.0000 mg | ORAL_TABLET | Freq: Two times a day (BID) | ORAL | 0 refills | Status: AC

## 2024-10-12 MED ORDER — SERTRALINE HCL 50 MG PO TABS: 50.0000 mg | ORAL_TABLET | Freq: Every day | ORAL | 0 refills | Status: AC

## 2024-10-12 MED ORDER — NURTEC 75 MG PO TBDP: 1.0000 | ORAL_TABLET | Freq: Every day | ORAL | 11 refills | Status: AC | PRN

## 2024-10-12 NOTE — Patient Instructions (Signed)
 VISIT SUMMARY:  Today, we reviewed your anxiety, cardiac symptoms, and migraines. Your anxiety and cardiac symptoms are well controlled, and your migraines are infrequent. We discussed your current medications and the need for follow-up with psychology services.  YOUR PLAN:  GENERALIZED ANXIETY DISORDER: Your anxiety is well controlled with your current medications. -Continue taking sertraline  and buspirone  at the current doses. -Continue taking metoprolol  as prescribed by cardiology. -We will investigate the denied psychology referral and try to resubmit it. -Look for in-network psychology providers and let us  know if you need a referral. -Follow up in three months to reassess your symptoms and confirm psychology services. -Message us  if you experience any breakthrough symptoms or need medication before your next visit.  CARDIAC PALPITATIONS AND PREMATURE VENTRICULAR CONTRACTIONS: Your palpitations and heart flutter sensations have resolved. -Continue taking metoprolol  as prescribed by cardiology.  MIGRAINE: Your migraines are infrequent and do not currently require abortive therapy. -Monitor for any increase in migraine frequency or severity and notify us  if you need abortive therapy. -We reviewed your EEG results and there is no need for further intervention. -Follow up in three months to reassess your headache control.

## 2024-10-12 NOTE — Progress Notes (Signed)
 "    Primary Care / Sports Medicine Virtual Visit  Patient Information:  Patient ID: Brandy Vasquez, female DOB: [birth year] Age: 48 y.o. MRN: [id number]   Brandy Vasquez is a pleasant 48 y.o. female presenting with the following:  Chief Complaint  Patient presents with   Anxiety    Review of Systems: No fevers, chills, night sweats, weight loss, chest pain, or shortness of breath.   Patient Active Problem List   Diagnosis Date Noted   Palpitations 09/19/2024   Sleep apnea-like behavior 09/19/2024   Migraine 08/14/2024   Near syncope 07/12/2024   Menstrual irregularity 07/12/2024   Dermatitis 03/19/2024   Anxiety 03/19/2024   Dizziness 02/25/2023   Screening for colon cancer 02/14/2023   Chronic idiopathic constipation 10/12/2022   Encounter for weight management 08/09/2022   Arthralgia of left hip 07/12/2022   Breast pain, left 02/05/2022   Leg cramps 01/19/2022   Class 1 obesity with serious comorbidity and body mass index (BMI) of 31.0 to 31.9 in adult 01/19/2022   Long-term use of immunosuppressant medication 12/24/2021   Rheumatoid arthritis involving multiple sites with positive rheumatoid factor (HCC) 12/24/2021   Injury of triangular fibrocartilage complex of left wrist 12/08/2021   Cyclic citrullinated peptide (CCP) antibody positive 10/29/2021   Cervical spondylosis with radiculopathy 10/07/2021   Gastroesophageal reflux disease without esophagitis 10/07/2021   Furuncle 08/26/2021   Cervical paraspinal muscle spasm 08/04/2021   Metabolic syndrome 05/27/2021   Rotator cuff arthropathy of left shoulder 05/27/2021   Primary hypertension 05/15/2021   Annual physical exam 04/03/2021   Fungal infection of skin 04/03/2021   Elevated blood pressure reading 04/03/2021   Dysplasia of cervix, low grade (CIN 1) 08/31/2018   Past Medical History:  Diagnosis Date   Allergy    Anxiety    Dysplasia of cervix, low grade (CIN 1)    GERD (gastroesophageal reflux disease)     Herpes    Hypertension 05/15/2021   on lisinopril /hctz10/12.5 for about a month only   Rheumatoid arthritis [facility])    Outpatient Encounter Medications as of 10/12/2024  Medication Sig   folic acid  (FOLVITE ) 1 MG tablet Take 1 mg by mouth daily.   levonorgestrel  (MIRENA ) 20 MCG/DAY IUD 1 each by Intrauterine route once.   methotrexate (RHEUMATREX) 2.5 MG tablet Take 15 mg by mouth once a week. sunday   metoprolol  succinate (TOPROL  XL) 25 MG 24 hr tablet Take 1 tablet (25 mg total) by mouth daily.   [DISCONTINUED] busPIRone  (BUSPAR ) 5 MG tablet Take 1 tablet (5 mg total) by mouth 2 (two) times daily. Initial: 10 mg/day PO in 2 divided doses; increase every 3 days in increments of 5 mg/day to a maximum of 60 mg/day. Find lowest effective dose.   [DISCONTINUED] Rimegepant Sulfate (NURTEC) 75 MG TBDP Take 1 tablet (75 mg total) by mouth daily as needed (Migraine). Can repeat in 24 hours if needed.   [DISCONTINUED] sertraline  (ZOLOFT ) 50 MG tablet Take 1 tablet (50 mg total) by mouth daily.   busPIRone  (BUSPAR ) 15 MG tablet Take 1 tablet (15 mg total) by mouth 2 (two) times daily.   Rimegepant Sulfate (NURTEC) 75 MG TBDP Take 1 tablet (75 mg total) by mouth daily as needed (Migraine). Can repeat in 24 hours if needed.   sertraline  (ZOLOFT ) 50 MG tablet Take 1 tablet (50 mg total) by mouth daily.   [DISCONTINUED] REMICADE 100 MG injection Inject 100 mg into the vein every 8 (eight) weeks. Next dose Feb 23, 2023 (Patient not  taking: Reported on 09/17/2024)   No facility-administered encounter medications on file as of 10/12/2024.   Past Surgical History:  Procedure Laterality Date   BREAST BIOPSY Left 2016   COLONOSCOPY WITH PROPOFOL  N/A 02/14/2023   Procedure: COLONOSCOPY WITH PROPOFOL ;  Surgeon: Unk Corinn Skiff, MD;  Location: [facility] END[facility]COPY;  Service: Gastroenterology;  Laterality: N/A;   INTRAUTERINE DEVICE (IUD) INSERTION N/A 01/31/2023   Procedure: INTRAUTERINE DEVICE (IUD) INSERTION;   Surgeon: Janit Alm Agent, MD;  Location: [facility] ORS;  Service: Gynecology;  Laterality: N/A;   IUD REMOVAL N/A 01/31/2023   Procedure: INTRAUTERINE DEVICE (IUD) REMOVAL;  Surgeon: Janit Alm Agent, MD;  Location: [facility] ORS;  Service: Gynecology;  Laterality: N/A;   LEEP N/A 01/31/2023   Procedure: LOOP ELECTROSURGICAL EXCISION PROCEDURE (LEEP);  Surgeon: Janit Alm Agent, MD;  Location: [facility] ORS;  Service: Gynecology;  Laterality: N/A;    Discussed the use of AI scribe software for clinical note transcription with the patient, who gave verbal consent to proceed.   Virtual Visit via MyChart Video:   I connected with Freddy Kiang on 10/12/2024 via MyChart Video and verified that I am speaking with the correct person using appropriate identifiers.   The limitations, risks, security and privacy concerns of performing an evaluation and management service by MyChart Video, including the higher likelihood of inaccurate diagnoses and treatments, and the availability of in person appointments were reviewed. The possible need of an additional face-to-face encounter for complete and high quality delivery of care was discussed. The patient was also made aware that there may be a patient responsible charge related to this service. The patient expressed understanding and wishes to proceed.  Provider location is in medical facility. Patient location is at their home, different from provider location. People involved in care of the patient during this telehealth encounter were myself, my nurse/medical assistant, and my front office/scheduling team member.  Objective findings:   General: Speaking full sentences, no audible heavy breathing. Sounds alert and appropriately interactive. Well-appearing. Face symmetric. Extraocular movements intact. Pupils equal and round. No nasal flaring or accessory muscle use visualized.  Independent interpretation of notes and tests performed by another provider:    None  Pertinent History, Exam, Impression, and Recommendations:   History of Present Illness Brandy Vasquez is a 48 year old female with generalized anxiety disorder, premature ventricular contractions, and migraine who presents for follow-up of anxiety and cardiac symptoms.  Anxiety symptoms - Symptoms are well controlled on current regimen of buspirone  and sertraline  - No recent exacerbations or breakthrough symptoms - Awaiting initiation of counseling/therapy but has not yet connected with a provider due to referral issues  Cardiac palpitations and premature ventricular contractions - Palpitations and sensations of heart flutter have resolved since October - Previously evaluated by cardiology with echocardiogram and ambulatory monitoring - Frequent premature ventricular contractions (PVCs) with a 5% PVC burden and otherwise normal cardiac function  Migraine and headache symptoms - Headaches have occurred over the past week, worsening with coughing - Diagnosis of migraine - Previously used Nurtec for abortive therapy but not currently using it due to infrequent and non-constant symptoms - Migraines were stable two months ago  Electroencephalogram findings - EEG performed in November demonstrated nonspecific left frontal slowing without epileptiform activity - No further intervention required and reassurance provided regarding these findings  Results Diagnostic Echocardiogram (09/17/2024): Normal cardiac function, no valvular abnormalities Cardiac monitor (09/17/2024): 5% premature ventricular contraction burden, otherwise normal EEG (09/17/2024): No epileptiform activity, nonspecific left frontal slowing  Assessment and Plan Generalized anxiety disorder Anxiety well controlled on sertraline , buspirone , and metoprolol . Awaiting psychology services due to referral issues. Psychiatry referral denied. - Continued sertraline  and buspirone  at current doses; advised against dose  escalation. - Continued metoprolol  as prescribed by cardiology. - Messaged nurse to investigate denied psychology referral and facilitate resubmission. - Advised her to search for in-network psychology providers and notify if referral needed. - Scheduled follow-up in three months to reassess symptoms and confirm psychology services. - Provided reassurance on current pharmacotherapy safety and appropriateness. - Instructed her to message for breakthrough symptoms or medication needs before next visit.  Migraine Migraine well controlled, infrequent headaches not requiring abortive therapy. EEG showed nonspecific left frontal slowing, likely stress or migraine-related. - Advised her to monitor for increased migraine frequency or severity and notify if abortive therapy needed. - Reviewed EEG results and reassured regarding nonspecific findings. - Scheduled follow-up in three months to reassess headache control.  Problem List Items Addressed This Visit     Anxiety - Primary   Relevant Medications   sertraline  (ZOLOFT ) 50 MG tablet   busPIRone  (BUSPAR ) 15 MG tablet   Migraine   Relevant Medications   sertraline  (ZOLOFT ) 50 MG tablet   Rimegepant Sulfate (NURTEC) 75 MG TBDP   Near syncope   Palpitations     Orders & Medications Medications:  Meds ordered this encounter  Medications   sertraline  (ZOLOFT ) 50 MG tablet    Sig: Take 1 tablet (50 mg total) by mouth daily.    Dispense:  90 tablet    Refill:  0   busPIRone  (BUSPAR ) 15 MG tablet    Sig: Take 1 tablet (15 mg total) by mouth 2 (two) times daily.    Dispense:  180 tablet    Refill:  0   Rimegepant Sulfate (NURTEC) 75 MG TBDP    Sig: Take 1 tablet (75 mg total) by mouth daily as needed (Migraine). Can repeat in 24 hours if needed.    Dispense:  8 tablet    Refill:  11   No orders of the defined types were placed in this encounter.    I discussed the above assessment and treatment plan with the patient. The patient was  provided an opportunity to ask questions and all were answered. The patient agreed with the plan and demonstrated an understanding of the instructions.   The patient was advised to call back or seek an in-person evaluation if the symptoms worsen or if the condition fails to improve as anticipated.   I provided a total time of 30 minutes including both face-to-face and non-face-to-face time on 10/12/2024 inclusive of time utilized for medical chart review, information gathering, care coordination with staff, and documentation completion.    [name], MD, [facility]   Primary Care Sports Medicine Primary Care and Sports Medicine at [facility]   "

## 2024-11-23 ENCOUNTER — Encounter: Payer: Self-pay | Admitting: Obstetrics

## 2024-11-23 NOTE — Progress Notes (Unsigned)
 "  ANNUAL PREVENTATIVE CARE GYNECOLOGY  ENCOUNTER NOTE  SUBJECTIVE:       Brandy Vasquez is a 49 y.o. G75P2012 female here for a routine annual gynecologic exam. The patient {is/is not/has never been:13135} sexually active. The patient {is/is not:13135} taking hormone replacement therapy. {post-men bleed:13152::Patient denies post-menopausal vaginal bleeding.} Family history of breast, uterine, ovarian cancer: {yes/no:311178}. The patient wears seatbelts: {yes/no:311178}. The patient participates in regular exercise: {yes/no/not asked:9010}. Has the patient ever been transfused or tattooed?: {yes/no/not asked:9010}. The patient reports that there {is/is not:9024} domestic violence in her life. Has the patient completed the Gardasil vaccine? {yes/no:311178}.  Current complaints: 1.  ***    Gynecologic History No LMP recorded. (Menstrual status: IUD). Contraception: IUD Last Pap: 09/30/2022. Results were: abnormal History of abnormal pap: LSIL HPV + in 2023, followed by a LEEP 01/05/23 with CIN 1 on ECC and cervix.  Pt has hx of another LEEP approximately 25 years ago but reports no other abnormal paps until 2023. History of STIs: *** Last Mammogram: 09/05/23. Results were: normal Last Colonoscopy: 02/14/2023 Last Dexa Scan: NA  PHQ-2:     10/12/2024    8:33 AM 09/12/2024   11:09 AM  Depression screen PHQ 2/9  Decreased Interest 0 2  Down, Depressed, Hopeless 0 2  PHQ - 2 Score 0 4  Altered sleeping 1 1  Tired, decreased energy 0 1  Change in appetite 0 1  Feeling bad or failure about yourself  0 0  Trouble concentrating 0 0  Moving slowly or fidgety/restless 0 0  Suicidal thoughts 0 0  PHQ-9 Score 1 7  Difficult doing work/chores Not difficult at all Somewhat difficult    Obstetric History OB History  Gravida Para Term Preterm AB Living  3 2 2  1 2   SAB IAB Ectopic Multiple Live Births  1    2    # Outcome Date GA Lbr Len/2nd Weight Sex Type Anes PTL Lv  3 SAB            2 Term           1 Term             Past Medical History:  Diagnosis Date   Allergy    Anxiety    Dysplasia of cervix, low grade (CIN 1)    GERD (gastroesophageal reflux disease)    Herpes    Hypertension 05/15/2021   on lisinopril /hctz10/12.5 for about a month only   Rheumatoid arthritis (HCC)     Family History  Problem Relation Age of Onset   Hypertension Mother    Diabetes Mother    Rheum arthritis Mother    Arthritis Mother    Cancer Father    COPD Father    Early death Father    ADD / ADHD Daughter    ADD / ADHD Daughter    Alzheimer's disease Maternal Grandmother    Stroke Maternal Grandfather    Ovarian cancer Paternal Grandmother    ADD / ADHD Daughter    ADD / ADHD Daughter     Past Surgical History:  Procedure Laterality Date   BREAST BIOPSY Left 2016   COLONOSCOPY WITH PROPOFOL  N/A 02/14/2023   Procedure: COLONOSCOPY WITH PROPOFOL ;  Surgeon: Unk Corinn Skiff, MD;  Location: [facility] ENDOSCOPY;  Service: Gastroenterology;  Laterality: N/A;   INTRAUTERINE DEVICE (IUD) INSERTION N/A 01/31/2023   Procedure: INTRAUTERINE DEVICE (IUD) INSERTION;  Surgeon: Janit Alm Agent, MD;  Location: [facility] ORS;  Service: Gynecology;  Laterality:  N/A;   IUD REMOVAL N/A 01/31/2023   Procedure: INTRAUTERINE DEVICE (IUD) REMOVAL;  Surgeon: Janit Alm Agent, MD;  Location: [facility] ORS;  Service: Gynecology;  Laterality: N/A;   LEEP N/A 01/31/2023   Procedure: LOOP ELECTROSURGICAL EXCISION PROCEDURE (LEEP);  Surgeon: Janit Alm Agent, MD;  Location: [facility] ORS;  Service: Gynecology;  Laterality: N/A;    Social History   Socioeconomic History   Marital status: Single    Spouse name: Not on file   Number of children: 2   Years of education: 12   Highest education level: 12th grade  Occupational History   Not on file  Tobacco Use   Smoking status: Never   Smokeless tobacco: Never  Vaping Use   Vaping status: Former   Substances: CBD  Substance and Sexual Activity   Alcohol  use: Not Currently    Alcohol/week: 2.0 standard drinks of alcohol   Drug use: Not Currently    Types: Marijuana   Sexual activity: Yes    Partners: Male    Birth control/protection: I.U.D.    Comment: Mirena   Other Topics Concern   Not on file  Social History Narrative   Not on file   Social Drivers of Health   Tobacco Use: Low Risk (10/12/2024)   Patient History    Smoking Tobacco Use: Never    Smokeless Tobacco Use: Never    Passive Exposure: Not on file  Financial Resource Strain: Low Risk (08/14/2024)   Overall Financial Resource Strain (CARDIA)    Difficulty of Paying Living Expenses: Not hard at all  Food Insecurity: No Food Insecurity (08/14/2024)   Epic    Worried About Programme Researcher, Broadcasting/film/video in the Last Year: Never true    Ran Out of Food in the Last Year: Never true  Transportation Needs: No Transportation Needs (08/14/2024)   Epic    Lack of Transportation (Medical): No    Lack of Transportation (Non-Medical): No  Physical Activity: Unknown (08/14/2024)   Exercise Vital Sign    Days of Exercise per Week: Patient declined    Minutes of Exercise per Session: Not on file  Stress: Stress Concern Present (08/14/2024)   [employer] of Occupational Health - Occupational Stress Questionnaire    Feeling of Stress: Very much  Social Connections: Moderately Integrated (08/14/2024)   Social Connection and Isolation Panel    Frequency of Communication with Friends and Family: More than three times a week    Frequency of Social Gatherings with Friends and Family: Twice a week    Attends Religious Services: 1 to 4 times per year    Active Member of [employer] or Organizations: No    Attends Banker Meetings: Not on file    Marital Status: Living with partner  Intimate Partner Violence: Not At Risk (09/12/2024)   Epic    Fear of Current or Ex-Partner: No    Emotionally Abused: No    Physically Abused: No    Sexually Abused: No  Depression (PHQ2-9): Low Risk  (10/12/2024)   Depression (PHQ2-9)    PHQ-2 Score: 1  Recent Concern: Depression (PHQ2-9) - Medium Risk (09/12/2024)   Depression (PHQ2-9)    PHQ-2 Score: 7  Alcohol Screen: Low Risk (07/12/2024)   Alcohol Screen    Last Alcohol Screening Score (AUDIT): 1  Housing: High Risk (08/14/2024)   Epic    Unable to Pay for Housing in the Last Year: Yes    Number of Times Moved in the Last Year: Not on  file    Homeless in the Last Year: No  Utilities: Not At Risk (09/12/2024)   Epic    Threatened with loss of utilities: No  Health Literacy: Not on file    Medications Ordered Prior to Encounter[1]  Allergies[2]   Review of Systems ROS Review of Systems - General ROS: negative for - chills, fatigue, fever, hot flashes, night sweats, weight gain or weight loss Psychological ROS: negative for - anxiety, decreased libido, depression, mood swings, physical abuse or sexual abuse Ophthalmic ROS: negative for - blurry vision, eye pain or loss of vision ENT ROS: negative for - headaches, hearing change, visual changes or vocal changes Allergy and Immunology ROS: negative for - hives, itchy/watery eyes or seasonal allergies Hematological and Lymphatic ROS: negative for - bleeding problems, bruising, swollen lymph nodes or weight loss Endocrine ROS: negative for - galactorrhea, hair pattern changes, hot flashes, malaise/lethargy, mood swings, palpitations, polydipsia/polyuria, skin changes, temperature intolerance or unexpected weight changes Breast ROS: negative for - new or changing breast lumps or nipple discharge Respiratory ROS: negative for - cough or shortness of breath Cardiovascular ROS: negative for - chest pain, irregular heartbeat, palpitations or shortness of breath Gastrointestinal ROS: no abdominal pain, change in bowel habits, or black or bloody stools Genito-Urinary ROS: no dysuria, trouble voiding, or hematuria Musculoskeletal ROS: negative for - joint pain or joint  stiffness Neurological ROS: negative for - bowel and bladder control changes Dermatological ROS: negative for rash and skin lesion changes   OBJECTIVE:   There were no vitals taken for this visit.  CONSTITUTIONAL: Well-developed, well-nourished female in no acute distress.  PSYCHIATRIC: Normal mood and affect. Normal behavior. Normal judgment and thought content. NEUROLGIC: Alert and oriented to person, place, and time. Normal muscle tone coordination. No cranial nerve deficit noted. HENT:  Normocephalic, atraumatic, External right and left ear normal. Oropharynx is clear and moist EYES: Conjunctivae and EOM are normal. No scleral icterus.  NECK: Normal range of motion, supple, no masses.  Normal thyroid .  SKIN: Skin is warm and dry. No rash noted. Not diaphoretic. No erythema. No pallor. CARDIOVASCULAR: Normal heart rate noted, regular rhythm, no murmur. RESPIRATORY: Clear to auscultation bilaterally. Effort and breath sounds normal, no problems with respiration noted. BREASTS: Symmetric in size. No masses, skin changes, nipple drainage, or lymphadenopathy. ABDOMEN: Soft, normal bowel sounds, no distention noted.  No tenderness, rebound or guarding.  PELVIC:  Bladder {:311640}  Urethra: {:311719}  Vulva: {:311722}  Vagina: {:311643}  Cervix: {:311644}  Uterus: {:311718}  Adnexa: {:311645}  RV: {Blank multiple:19196::External Exam NormaI,No Rectal Masses,Normal Sphincter tone}  MUSCULOSKELETAL: Normal range of motion. No tenderness.  No cyanosis, clubbing, or edema.  2+ distal pulses. LYMPHATIC: No Axillary, Supraclavicular, or Inguinal Adenopathy.  Labs: Lab Results  Component Value Date   WBC 10.7 (H) 07/21/2024   HGB 13.3 07/21/2024   HCT 39.4 07/21/2024   MCV 96.6 07/21/2024   PLT 265 07/21/2024    Lab Results  Component Value Date   CREATININE 0.89 07/21/2024   BUN 9 07/21/2024   NA 140 07/21/2024   K 3.4 (L) 07/21/2024   CL 104 07/21/2024   CO2 24  07/21/2024    Lab Results  Component Value Date   ALT 12 07/21/2024   AST 18 07/21/2024   ALKPHOS 108 07/21/2024   BILITOT 1.1 07/21/2024    Lab Results  Component Value Date   CHOL 157 07/12/2024   HDL 43 07/12/2024   LDLCALC 103 (H) 07/12/2024   TRIG  56 07/12/2024   CHOLHDL 3.7 07/12/2024    Lab Results  Component Value Date   TSH 0.826 07/12/2024    Lab Results  Component Value Date   HGBA1C 4.8 07/12/2024     ASSESSMENT:   No diagnosis found.   PLAN:   Brandy Vasquez is a 49 y.o. [phone] female here today for her annual exam, doing well.  Pap: done with cotesting today Mammogram: ordered***due *** Colon: PCP *** ordered colonoscopy***Cologuard -OR- due *** Labs: ***A1C, CMP, HepC, Lipid panel, Vit D, TSH PHQ-2 = ***, discussed coping techniques; RTC if worsens or develops concern Contraception: *** Healthy lifestyle modifications discussed: multivitamin, diet, exercise, sunscreen, tobacco and alcohol use. Emphasized importance of regular physical activity.  Calcium and Vit D recommendation reviewed.  All questions answered to patient's satisfaction.   Follow up 1 yr for annual, sooner prn.    Estil Mangle, DO [facility] at [facility]                  [1]  Current Outpatient Medications on File Prior to Visit  Medication Sig Dispense Refill   busPIRone  (BUSPAR ) 15 MG tablet Take 1 tablet (15 mg total) by mouth 2 (two) times daily. 180 tablet 0   folic acid  (FOLVITE ) 1 MG tablet Take 1 mg by mouth daily.     levonorgestrel  (MIRENA ) 20 MCG/DAY IUD 1 each by Intrauterine route once.     methotrexate (RHEUMATREX) 2.5 MG tablet Take 15 mg by mouth once a week. sunday     metoprolol  succinate (TOPROL  XL) 25 MG 24 hr tablet Take 1 tablet (25 mg total) by mouth daily. 90 tablet 3   Rimegepant Sulfate (NURTEC) 75 MG TBDP Take 1 tablet (75 mg total) by mouth daily as needed (Migraine). Can repeat in 24 hours if needed. 8 tablet 11   sertraline   (ZOLOFT ) 50 MG tablet Take 1 tablet (50 mg total) by mouth daily. 90 tablet 0   No current facility-administered medications on file prior to visit.  [2]  Allergies Allergen Reactions   Sulfamethoxazole-Trimethoprim Rash, Dermatitis and Other (See Comments)    Mouth sores   "

## 2024-11-27 ENCOUNTER — Other Ambulatory Visit: Payer: Self-pay | Admitting: Family Medicine

## 2024-11-27 DIAGNOSIS — Z1231 Encounter for screening mammogram for malignant neoplasm of breast: Secondary | ICD-10-CM

## 2024-11-28 ENCOUNTER — Ambulatory Visit: Admitting: Obstetrics

## 2024-11-28 DIAGNOSIS — Z1231 Encounter for screening mammogram for malignant neoplasm of breast: Secondary | ICD-10-CM

## 2024-11-28 DIAGNOSIS — Z01419 Encounter for gynecological examination (general) (routine) without abnormal findings: Secondary | ICD-10-CM

## 2024-11-28 DIAGNOSIS — Z124 Encounter for screening for malignant neoplasm of cervix: Secondary | ICD-10-CM

## 2024-12-24 ENCOUNTER — Encounter

## 2025-01-07 ENCOUNTER — Ambulatory Visit: Admitting: Family Medicine
